# Patient Record
Sex: Female | Born: 1940 | Race: White | Hispanic: No | Smoking: Former smoker
Health system: Southern US, Community
[De-identification: ages and names within clinical notes are randomized; demographics above are authoritative.]

## PROBLEM LIST (undated history)

## (undated) DIAGNOSIS — J45909 Unspecified asthma, uncomplicated: Secondary | ICD-10-CM

## (undated) DIAGNOSIS — J449 Chronic obstructive pulmonary disease, unspecified: Secondary | ICD-10-CM

## (undated) DIAGNOSIS — F0390 Unspecified dementia without behavioral disturbance: Secondary | ICD-10-CM

## (undated) HISTORY — PX: TONSILLECTOMY: SUR1361

## (undated) HISTORY — PX: EYE SURGERY: SHX253

## (undated) HISTORY — DX: Unspecified asthma, uncomplicated: J45.909

---

## 1994-10-22 HISTORY — PX: LIVER REPAIR: SHX6734

## 2000-05-31 ENCOUNTER — Emergency Department (HOSPITAL_COMMUNITY): Admission: EM | Admit: 2000-05-31 | Discharge: 2000-05-31 | Payer: Self-pay | Admitting: Emergency Medicine

## 2000-05-31 ENCOUNTER — Encounter: Payer: Self-pay | Admitting: Emergency Medicine

## 2000-06-01 ENCOUNTER — Encounter: Payer: Self-pay | Admitting: Emergency Medicine

## 2000-06-26 ENCOUNTER — Encounter: Payer: Self-pay | Admitting: General Surgery

## 2000-06-27 ENCOUNTER — Inpatient Hospital Stay (HOSPITAL_COMMUNITY): Admission: EM | Admit: 2000-06-27 | Discharge: 2000-06-30 | Payer: Self-pay | Admitting: General Surgery

## 2012-02-11 ENCOUNTER — Telehealth: Payer: Self-pay

## 2012-02-11 ENCOUNTER — Other Ambulatory Visit: Payer: Self-pay | Admitting: Emergency Medicine

## 2012-02-11 DIAGNOSIS — Z111 Encounter for screening for respiratory tuberculosis: Secondary | ICD-10-CM

## 2012-02-11 NOTE — Telephone Encounter (Signed)
ESI placed and orders only for a single view chest x-ray for patient to have in the future

## 2012-02-11 NOTE — Telephone Encounter (Signed)
Pt is required to have tb test administered,cannot take needle stick(phobia),wants to request chest x-ray instead.will need order by dr Cleta Alberts??   Best phone [phone]

## 2012-02-11 NOTE — Telephone Encounter (Signed)
Can you order this?

## 2012-02-12 ENCOUNTER — Ambulatory Visit (INDEPENDENT_AMBULATORY_CARE_PROVIDER_SITE_OTHER): Payer: Medicare Other | Admitting: Internal Medicine

## 2012-02-12 ENCOUNTER — Ambulatory Visit: Payer: Medicare Other

## 2012-02-12 VITALS — BP 156/70 | HR 60 | Temp 97.5°F | Resp 16 | Ht 60.5 in | Wt 126.4 lb

## 2012-02-12 DIAGNOSIS — J45909 Unspecified asthma, uncomplicated: Secondary | ICD-10-CM

## 2012-02-12 MED ORDER — ALBUTEROL SULFATE HFA 108 (90 BASE) MCG/ACT IN AERS: 2.0000 | INHALATION_SPRAY | Freq: Four times a day (QID) | RESPIRATORY_TRACT | Status: DC | PRN

## 2012-02-12 MED ORDER — FLUTICASONE-SALMETEROL 100-50 MCG/DOSE IN AEPB: 1.0000 | INHALATION_SPRAY | Freq: Two times a day (BID) | RESPIRATORY_TRACT | Status: DC

## 2012-02-12 NOTE — Telephone Encounter (Signed)
Pt came into 102 and saw Dr Merla Riches and had her xray done on 02/12/12

## 2012-02-12 NOTE — Progress Notes (Signed)
  Subjective:    Patient ID: Cheryl Olson, female    DOB: 01-Sep-1941, 71 y.o.   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 Her illness is relatively stable and she has a rare need for her inhalers. She wonders if she was misdiagnosed and this is related to some childhood scarring. She has no current cough night sweats weight loss shortness of breath edema palpitations or generalized malaise It is pertinent that she has had a needle phobia since childhood   Review of SystemsNoncontributory Stable in recovery     Objective:   Physical Exam Vital signs stable except mild systolic hypertension Lungs clear   [facility] reading (PRIMARY) by  Dr. =Elevated right hemidiaphragm but no active disease       Assessment & Plan:  Problem #1 asthma mild Refill meds in case she needs them  Problem #2 exposure to communicable disease at work Her chest x-ray is clear  she should not need to be reevaluated unless she developsFever with cough and night sweats and/or weight loss

## 2012-05-31 ENCOUNTER — Emergency Department (HOSPITAL_COMMUNITY): Payer: Medicare Other

## 2012-05-31 ENCOUNTER — Encounter (HOSPITAL_COMMUNITY): Payer: Self-pay | Admitting: Emergency Medicine

## 2012-05-31 ENCOUNTER — Observation Stay (HOSPITAL_COMMUNITY)
Admission: EM | Admit: 2012-05-31 | Discharge: 2012-06-01 | Disposition: A | Discharge status: Home / Self Care | Payer: Medicare Other | Attending: Internal Medicine | Admitting: Internal Medicine

## 2012-05-31 DIAGNOSIS — R5381 Other malaise: Secondary | ICD-10-CM | POA: Insufficient documentation

## 2012-05-31 DIAGNOSIS — I169 Hypertensive crisis, unspecified: Secondary | ICD-10-CM

## 2012-05-31 DIAGNOSIS — R61 Generalized hyperhidrosis: Secondary | ICD-10-CM | POA: Insufficient documentation

## 2012-05-31 DIAGNOSIS — I10 Essential (primary) hypertension: Principal | ICD-10-CM | POA: Diagnosis present

## 2012-05-31 DIAGNOSIS — R42 Dizziness and giddiness: Secondary | ICD-10-CM | POA: Diagnosis present

## 2012-05-31 DIAGNOSIS — J4489 Other specified chronic obstructive pulmonary disease: Secondary | ICD-10-CM | POA: Insufficient documentation

## 2012-05-31 DIAGNOSIS — J449 Chronic obstructive pulmonary disease, unspecified: Secondary | ICD-10-CM

## 2012-05-31 DIAGNOSIS — E871 Hypo-osmolality and hyponatremia: Secondary | ICD-10-CM | POA: Diagnosis present

## 2012-05-31 DIAGNOSIS — R5383 Other fatigue: Secondary | ICD-10-CM | POA: Insufficient documentation

## 2012-05-31 HISTORY — DX: Chronic obstructive pulmonary disease, unspecified: J44.9

## 2012-05-31 LAB — URINALYSIS, ROUTINE W REFLEX MICROSCOPIC
Hgb urine dipstick: NEGATIVE
Nitrite: NEGATIVE
Specific Gravity, Urine: 1.012 (ref 1.005–1.030)
Urobilinogen, UA: 0.2 mg/dL (ref 0.0–1.0)
pH: 7 (ref 5.0–8.0)

## 2012-05-31 LAB — CBC WITH DIFFERENTIAL/PLATELET
Basophils Absolute: 0 10*3/uL (ref 0.0–0.1)
Basophils Relative: 0 % (ref 0–1)
Eosinophils Absolute: 0.1 10*3/uL (ref 0.0–0.7)
Eosinophils Relative: 1 % (ref 0–5)
HCT: 39 % (ref 36.0–46.0)
MCH: 31.1 pg (ref 26.0–34.0)
MCHC: 34.4 g/dL (ref 30.0–36.0)
MCV: 90.5 fL (ref 78.0–100.0)
Monocytes Absolute: 0.3 10*3/uL (ref 0.1–1.0)
Platelets: 250 10*3/uL (ref 150–400)
RDW: 13.3 % (ref 11.5–15.5)
WBC: 6.9 10*3/uL (ref 4.0–10.5)

## 2012-05-31 LAB — COMPREHENSIVE METABOLIC PANEL
ALT: 15 U/L (ref 0–35)
AST: 25 U/L (ref 0–37)
CO2: 23 mEq/L (ref 19–32)
Calcium: 9.7 mg/dL (ref 8.4–10.5)
Creatinine, Ser: 0.69 mg/dL (ref 0.50–1.10)
GFR calc non Af Amer: 86 mL/min — ABNORMAL LOW (ref >90)
Sodium: 131 mEq/L — ABNORMAL LOW (ref 135–145)
Total Protein: 7.2 g/dL (ref 6.0–8.3)

## 2012-05-31 LAB — CARDIAC PANEL(CRET KIN+CKTOT+MB+TROPI): Relative Index: INVALID (ref 0.0–2.5)

## 2012-05-31 LAB — TROPONIN I: Troponin I: <0.3 ng/mL (ref <0.30)

## 2012-05-31 MED ORDER — ONDANSETRON HCL 4 MG PO TABS: 4.0000 mg | ORAL_TABLET | Freq: Four times a day (QID) | ORAL | Status: DC | PRN

## 2012-05-31 MED ORDER — ONDANSETRON HCL 4 MG/2ML IJ SOLN: 4.0000 mg | Freq: Four times a day (QID) | INTRAMUSCULAR | Status: DC | PRN

## 2012-05-31 MED ORDER — ACETAMINOPHEN 325 MG PO TABS: 650.0000 mg | ORAL_TABLET | Freq: Four times a day (QID) | ORAL | Status: DC | PRN

## 2012-05-31 MED ORDER — LISINOPRIL 10 MG PO TABS
10.0000 mg | ORAL_TABLET | Freq: Every day | ORAL | Status: DC
  Administered 2012-05-31 – 2012-06-01 (×2): 10 mg via ORAL
  Filled 2012-05-31 (×2): qty 1

## 2012-05-31 MED ORDER — ENOXAPARIN SODIUM 40 MG/0.4ML ~~LOC~~ SOLN
40.0000 mg | SUBCUTANEOUS | Status: DC
  Filled 2012-05-31 (×2): qty 0.4

## 2012-05-31 MED ORDER — SODIUM CHLORIDE 0.9 % IJ SOLN: 3.0000 mL | Freq: Two times a day (BID) | INTRAMUSCULAR | Status: DC

## 2012-05-31 MED ORDER — HYDRALAZINE HCL 20 MG/ML IJ SOLN
5.0000 mg | Freq: Four times a day (QID) | INTRAMUSCULAR | Status: DC | PRN
  Filled 2012-05-31: qty 0.25

## 2012-05-31 MED ORDER — ALBUTEROL SULFATE HFA 108 (90 BASE) MCG/ACT IN AERS: 2.0000 | INHALATION_SPRAY | Freq: Four times a day (QID) | RESPIRATORY_TRACT | Status: DC | PRN

## 2012-05-31 MED ORDER — ACETAMINOPHEN 650 MG RE SUPP: 650.0000 mg | Freq: Four times a day (QID) | RECTAL | Status: DC | PRN

## 2012-05-31 MED ORDER — SODIUM CHLORIDE 0.9 % IV SOLN
INTRAVENOUS | Status: DC
  Administered 2012-05-31: 1000 mL via INTRAVENOUS

## 2012-05-31 MED ORDER — LABETALOL HCL 5 MG/ML IV SOLN
10.0000 mg | Freq: Once | INTRAVENOUS | Status: AC
  Administered 2012-05-31: 10 mg via INTRAVENOUS
  Filled 2012-05-31: qty 4

## 2012-05-31 MED ORDER — FLUTICASONE-SALMETEROL 100-50 MCG/DOSE IN AEPB
1.0000 | INHALATION_SPRAY | Freq: Two times a day (BID) | RESPIRATORY_TRACT | Status: DC
  Filled 2012-05-31: qty 14

## 2012-05-31 MED ORDER — LORAZEPAM 2 MG/ML IJ SOLN: 0.5000 mg | Freq: Four times a day (QID) | INTRAMUSCULAR | Status: DC | PRN

## 2012-05-31 MED ORDER — DIAZEPAM 5 MG PO TABS: 5.0000 mg | ORAL_TABLET | Freq: Once | ORAL | Status: DC

## 2012-05-31 NOTE — ED Notes (Signed)
Pt developed clamminess, weakness, dizziness, and one episode of emesis following group meeting at 1100 this a.m. Denies any pain. Since has had one episode of nausea w/o emesis

## 2012-05-31 NOTE — ED Notes (Signed)
Pt has walked to xray with xray tech. Pt getting a urine sample in xray. Will get EKG when pt returns

## 2012-05-31 NOTE — H&P (Signed)
[facility] History and Physical  Cheryl Olson MVH:[id number] DOB: 1941/06/04 DOA: 05/31/2012  Referring physician: Chaney Malling PCP: Lucilla Edin, MD   Chief Complaint: Accelerated hypertension  HPI:  [name] 71 year old Caucasian female with past medical history of COPD. She came into the hospital complaining about diaphoresis and generalized weakness. 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 After that patient felt lightheaded but she denies any feeling that she was about to faint. After she rested for some time she felt better and she decided to drive home, while she was driving home she has small episode of vomiting. But she still drove home, she was debating with herself to come to the emergency department or not. Then she came into the emergency department after she had even a smaller episode of vomiting. Also the patient denies any chest pain whatsoever, she denies any shortness of breath, denies any palpitations denies any fainting. With the vomiting she denied nausea before that, denied any abdominal pain, denied any diarrhea, she did not eat out recently. At the initial evaluation in the emergency department patient was found to have blood pressure of 199/78, with mild hyponatremia with a sodium of 133. Patient will be placed in observation overnight for the high blood pressure.  Review of Systems:  Constitutional: negative for anorexia, fevers and sweats Eyes: negative for irritation, redness and visual disturbance Ears, nose, mouth, throat, and face: negative for earaches, epistaxis, nasal congestion and sore throat Respiratory: negative for cough, dyspnea on exertion, sputum and wheezing Cardiovascular: Per history of present illness Gastrointestinal: Per history of present illness  Genitourinary:negative for dysuria, frequency  and hematuria Hematologic/lymphatic: negative for bleeding, easy bruising and lymphadenopathy Musculoskeletal:negative for arthralgias, muscle weakness and stiff joints Neurological: negative for coordination problems, gait problems, headaches and weakness Endocrine: negative for diabetic symptoms including polydipsia, polyuria and weight loss Allergic/Immunologic: negative for anaphylaxis, hay fever and urticaria   Past Medical History  Diagnosis Date  . COPD (chronic obstructive pulmonary disease)    Past Surgical History  Procedure Date  . Tonsillectomy    Social History:  reports that she has quit smoking. She has never used smokeless tobacco. She reports that she does not drink alcohol or use illicit drugs. Lives at home alone, she is ambulatory without help, her daughter and her son lives in the same street.  No Known Allergies  No family history on file.   Prior to Admission medications   Medication Sig Start Date End Date Taking? Authorizing Provider  albuterol (PROVENTIL HFA;VENTOLIN HFA) 108 (90 BASE) MCG/ACT inhaler Inhale 2 puffs into the lungs every 6 (six) hours as needed. 02/12/12 02/11/13 Yes Tonye Pearson, MD  diphenhydrAMINE (BENADRYL) 12.5 MG/5ML elixir Take 25 mg by mouth 4 (four) times daily as needed. For itching   Yes Historical Provider, MD  Fluticasone-Salmeterol (ADVAIR) 100-50 MCG/DOSE AEPB Inhale 1 puff into the lungs every 12 (twelve) hours. 02/12/12  Yes Tonye Pearson, MD   Physical Exam: Filed Vitals:   05/31/12 1425 05/31/12 1737 05/31/12 1739 05/31/12 1839  BP: 199/78 155/70 155/60 169/72  Pulse: 83   64  Temp: 97.9 F (36.6 C)     TempSrc: Oral     Resp: 16 16 19 13   Height: 5\' 2"  (1.575 m)     Weight: 61.236 kg (135 lb)     SpO2: 98%  97%   General appearance: alert, cooperative and no distress  Head: Normocephalic, without obvious abnormality, atraumatic  Eyes: conjunctivae/corneas clear. PERRL, EOM's intact. Fundi benign.    Nose: Nares normal. Septum midline. Mucosa normal. No drainage or sinus tenderness.  Throat: lips, mucosa, and tongue normal; teeth and gums normal  Neck: Supple, no masses, no cervical lymphadenopathy, no JVD appreciated, no meningeal signs Resp: clear to auscultation bilaterally  Chest wall: no tenderness  Cardio: regular rate and rhythm, S1, S2 normal, no murmur, click, rub or gallop  GI: soft, non-tender; bowel sounds normal; no masses, no organomegaly  Extremities: extremities normal, atraumatic, no cyanosis or edema  Skin: Skin color, texture, turgor normal. No rashes or lesions  Neurologic: Alert and oriented X 3, normal strength and tone. Normal symmetric reflexes. Normal coordination and gait  Labs on Admission:  Basic Metabolic Panel:  Lab 05/31/12 7829  NA 131*  K 3.9  CL 95*  CO2 23  GLUCOSE 99  BUN 13  CREATININE 0.69  CALCIUM 9.7  MG --  PHOS --   Liver Function Tests:  Lab 05/31/12 1654  AST 25  ALT 15  ALKPHOS 74  BILITOT 0.5  PROT 7.2  ALBUMIN 4.4   No results found for this basename: LIPASE:5,AMYLASE:5 in the last 168 hours No results found for this basename: AMMONIA:5 in the last 168 hours CBC:  Lab 05/31/12 1654  WBC 6.9  NEUTROABS 5.3  HGB 13.4  HCT 39.0  MCV 90.5  PLT 250   Cardiac Enzymes:  Lab 05/31/12 1654  CKTOTAL --  CKMB --  CKMBINDEX --  TROPONINI <0.30    BNP (last 3 results) No results found for this basename: PROBNP:3 in the last 8760 hours CBG: No results found for this basename: GLUCAP:5 in the last 168 hours  Radiological Exams on Admission: Dg Chest 2 View  05/31/2012  *RADIOLOGY REPORT*  Clinical Data: Generalized weakness.  Diaphoresis.  History of COPD and asthma.  Former smoker.  CHEST - 2 VIEW  Comparison: Two-view chest x-ray 02/12/2012.  Findings: Cardiomediastinal silhouette unremarkable, unchanged. Elevation of the right hemidiaphragm with associated pleuroparenchymal scarring at the right base, unchanged.   Lungs otherwise clear.  No pleural effusions.  Degenerative changes involving the thoracic spine.  No significant interval change.  IMPRESSION: No acute cardiopulmonary disease.  Stable examination.  Original Report Authenticated By: Arnell Sieving, M.D.   Ct Head Wo Contrast  05/31/2012  *RADIOLOGY REPORT*  Clinical Data: Generalized weakness.  CT HEAD WITHOUT CONTRAST  Technique:  Contiguous axial images were obtained from the base of the skull through the vertex without contrast.  Comparison: None.  Findings: No intracranial hemorrhage.  Prominent white matter type changes may represent result of small vessel disease.  No CT evidence of large acute infarct.  No intracranial mass lesion detected on this unenhanced exam.  No hydrocephalus.  Vascular calcifications.  IMPRESSION:  Prominent white matter type changes may represent result of small vessel disease.  No CT evidence of large acute infarct.  Original Report Authenticated By: Fuller Canada, M.D.    EKG: Independently reviewed. NSR  Assessment/Plan Principal Problem:  *Accelerated hypertension Active Problems:  Lightheadedness  COPD (chronic obstructive pulmonary disease)  Hyponatremia   Accelerated hypertension Patient denies any previous diagnoses of hypertension, notes from Dr. office before mentioned that she have mild elevated systolic blood pressure, but he did not mention the readings. Patient was given labetalol in the emergency department I start patient on lisinopril 10 mg  daily, hydralazine for systolic pressure of more than 160. Also 3 sets of cardiac enzymes ordered.  Lightheadedness Diaphoresis and lightheadedness, it could be secondary to the hospital pressure, patient also have some hyponatremia might indicate a little but of dehydration. Patient will be hydrated with IV fluids, she is currently denying any lightheadedness. CT head without contrast negative., No focal neurological signs.  Hyponatremia Mild  hyponatremia with sodium of 133, I will hydrate with IV fluids check BMP in the morning, likely mild dehydration.  COPD Stable chronic condition, continue her home inhaled bronchodilator/steroids  Code Status: Full Family Communication: Patient daughter Judeth Cornfield, and her son at bedside, plan discussed with patient and her family. Disposition Plan: Observation overnight on telemetry bed  Time spent: 60 Minutes  [facility] Pager [phone]  If 7PM-7AM, please contact night-coverage [email or web address] Password [email or web address] 05/31/2012, 7:40 PM

## 2012-05-31 NOTE — ED Provider Notes (Signed)
History     CSN: [id number]  Arrival date & time 05/31/12  1423   First MD Initiated Contact with Patient 05/31/12 1527      Chief Complaint  Patient presents with  . Weakness    (Consider location/radiation/quality/duration/timing/severity/associated sxs/prior treatment) HPI Comments: Cheryl Olson 71 y.o. female   The chief complaint is: Patient presents with:   Weakness   The patient has medical history significant for:   Past Medical History:   COPD (chronic obstructive pulmonary disease)                Patient presents for an acute episode of dizziness. Patient states that 11:30am she was overcome with a feeling of clamminess and nausea, with one episode of vomiting. She states that she had an episode like this in the past 10 years ago when she combined OTC cough syrup with her inhaler. Patient recounts that she recently took Children's Benadryl for bug bites and inhaler for COPD. She believes that this may be the result of a drug interaction. Denies fever, chills. Denies diarrhea or abdominal pain. Denies SOB, CP, but reports mild palpitations. Denies dysuria, frequency, or urgency. Denies sick contacts. Denies history of syncopal episodes.      Patient is a 71 y.o. female presenting with weakness. The history is provided by the patient.  Weakness The primary symptoms include dizziness. Primary symptoms do not include headaches, fever, nausea or vomiting.  Dizziness also occurs with weakness and diaphoresis. Dizziness does not occur with nausea or vomiting.   Additional symptoms include weakness.    Past Medical History  Diagnosis Date  . COPD (chronic obstructive pulmonary disease)     Past Surgical History  Procedure Date  . Tonsillectomy     No family history on file.  History  Substance Use Topics  . Smoking status: Former Games developer  . Smokeless tobacco: Never Used  . 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 Negative for fever and chills.       Clamminess  Eyes: Negative for visual disturbance.  Respiratory: Negative for shortness of breath.   Cardiovascular: Negative for chest pain, palpitations and leg swelling.  Gastrointestinal: Negative for nausea, vomiting, abdominal pain and diarrhea.  Genitourinary: Negative for dysuria and urgency.  Musculoskeletal: Negative for gait problem.  Neurological: Positive for dizziness and weakness. Negative for syncope, numbness and headaches.  All other systems reviewed and are negative.    Allergies  Review of patient's allergies indicates no known allergies.  Home Medications   Current Outpatient Rx  Name Route Sig Dispense Refill  . ALBUTEROL SULFATE HFA 108 (90 BASE) MCG/ACT IN AERS Inhalation Inhale 2 puffs into the lungs every 6 (six) hours as needed.    Marland Kitchen DIPHENHYDRAMINE HCL 12.5 MG/5ML PO ELIX Oral Take 25 mg by mouth 4 (four) times daily as needed. For itching    . FLUTICASONE-SALMETEROL 100-50 MCG/DOSE IN AEPB Inhalation Inhale 1 puff into the lungs every 12 (twelve) hours.      BP 199/78  Pulse 83  Temp 97.9 F (36.6 C) (Oral)  Resp 16  Ht 5\' 2"  (1.575 m)  Wt 135 lb (61.236 kg)  BMI 24.69 kg/m2  SpO2 98%  Physical Exam  Nursing note and vitals reviewed. Constitutional: She is oriented to person, place, and time. She appears well-developed and well-nourished.  HENT:  Head: Normocephalic and atraumatic.  Mouth/Throat: Oropharynx is clear and moist.  Eyes: Conjunctivae and EOM are normal. Pupils are equal, round, and reactive to light.  Neck: Normal range of motion. Neck supple.  Cardiovascular: Normal rate, regular rhythm, normal heart sounds and intact distal pulses.   Pulmonary/Chest: Effort normal and breath sounds normal.  Abdominal: Soft. Bowel sounds are normal. There is no tenderness.    Musculoskeletal: Normal range of motion. She exhibits no edema.  Neurological: She is alert and oriented to person, place, and time. No cranial nerve deficit. She exhibits normal muscle tone. Coordination normal.       Cranial nerves II-XII intact. Romberg & Pronator negative.  Skin: Skin is warm and dry.    ED Course  Procedures (including critical care time)   Labs Reviewed  CBC WITH DIFFERENTIAL  COMPREHENSIVE METABOLIC PANEL  URINALYSIS, ROUTINE W REFLEX MICROSCOPIC  TROPONIN I   Ct Head Wo Contrast  05/31/2012  *RADIOLOGY REPORT*  Clinical Data: Generalized weakness.  CT HEAD WITHOUT CONTRAST  Technique:  Contiguous axial images were obtained from the base of the skull through the vertex without contrast.  Comparison: None.  Findings: No intracranial hemorrhage.  Prominent white matter type changes may represent result of small vessel disease.  No CT evidence of large acute infarct.  No intracranial mass lesion detected on this unenhanced exam.  No hydrocephalus.  Vascular calcifications.  IMPRESSION:  Prominent white matter type changes may represent result of small vessel disease.  No CT evidence of large acute infarct.  Original Report Authenticated By: Fuller Canada, M.D.       DG Chest 2 View (Final result)   Result time:05/31/12 1710    Final result by Rad Results In Interface (05/31/12 17:10:46)    Narrative:   *RADIOLOGY REPORT*  Clinical Data: Generalized weakness. Diaphoresis. History of COPD and asthma. Former smoker.  CHEST - 2 VIEW  Comparison: Two-view chest x-ray 02/12/2012.  Findings: Cardiomediastinal silhouette unremarkable, unchanged. Elevation of the right hemidiaphragm with associated pleuroparenchymal scarring at the right base, unchanged. Lungs otherwise clear. No pleural effusions. Degenerative changes involving the thoracic spine. No significant interval change.  IMPRESSION: No acute cardiopulmonary disease. Stable examination.  Original  Report Authenticated By: Arnell Sieving, M.D   Results for orders placed during the hospital encounter of 05/31/12  CBC WITH DIFFERENTIAL      Component Value Range   WBC 6.9  4.0 - 10.5 K/uL   RBC 4.31  3.87 - 5.11 MIL/uL   Hemoglobin 13.4  12.0 - 15.0 g/dL   HCT 16.1  09.6 - 04.5 %   MCV 90.5  78.0 - 100.0 fL   MCH 31.1  26.0 - 34.0 pg   MCHC 34.4  30.0 - 36.0 g/dL   RDW 40.9  81.1 - 91.4 %   Platelets 250  150 - 400 K/uL   Neutrophils Relative 77  43 - 77 %   Neutro Abs 5.3  1.7 - 7.7 K/uL   Lymphocytes Relative 18  12 - 46 %   Lymphs Abs 1.3  0.7 - 4.0 K/uL   Monocytes Relative 4  3 - 12 %   Monocytes Absolute 0.3  0.1 - 1.0 K/uL   Eosinophils Relative 1  0 - 5 %   Eosinophils Absolute 0.1  0.0 - 0.7 K/uL   Basophils Relative 0  0 - 1 %   Basophils Absolute 0.0  0.0 - 0.1 K/uL  COMPREHENSIVE METABOLIC PANEL      Component Value Range   Sodium 131 (*) 135 - 145 mEq/L   Potassium 3.9  3.5 - 5.1 mEq/L   Chloride 95 (*) 96 - 112 mEq/L   CO2 23  19 - 32 mEq/L   Glucose, Bld 99  70 - 99 mg/dL   BUN 13  6 - 23 mg/dL   Creatinine, Ser 7.82  0.50 - 1.10 mg/dL   Calcium 9.7  8.4 - 95.6 mg/dL   Total Protein 7.2  6.0 - 8.3 g/dL   Albumin 4.4  3.5 - 5.2 g/dL   AST 25  0 - 37 U/L   ALT 15  0 - 35 U/L   Alkaline Phosphatase 74  39 - 117 U/L   Total Bilirubin 0.5  0.3 - 1.2 mg/dL   GFR calc non Af Amer 86 (*) >90 mL/min   GFR calc Af Amer >90  >90 mL/min  URINALYSIS, ROUTINE W REFLEX MICROSCOPIC      Component Value Range   Color, Urine YELLOW  YELLOW   APPearance CLEAR  CLEAR   Specific Gravity, Urine 1.012  1.005 - 1.030   pH 7.0  5.0 - 8.0   Glucose, UA NEGATIVE  NEGATIVE mg/dL   Hgb urine dipstick NEGATIVE  NEGATIVE   Bilirubin Urine NEGATIVE  NEGATIVE   Ketones, ur NEGATIVE  NEGATIVE mg/dL   Protein, ur NEGATIVE  NEGATIVE mg/dL   Urobilinogen, UA 0.2  0.0 - 1.0 mg/dL   Nitrite NEGATIVE  NEGATIVE   Leukocytes, UA NEGATIVE  NEGATIVE  TROPONIN I      Component  Value Range   Troponin I <0.30  <0.30 ng/mL     Results for orders placed during the hospital encounter of 05/31/12  CBC WITH DIFFERENTIAL      Component Value Range   WBC 6.9  4.0 - 10.5 K/uL   RBC 4.31  3.87 - 5.11 MIL/uL   Hemoglobin 13.4  12.0 - 15.0 g/dL   HCT 21.3  08.6 - 57.8 %   MCV 90.5  78.0 - 100.0 fL   MCH 31.1  26.0 - 34.0 pg   MCHC 34.4  30.0 - 36.0 g/dL   RDW 46.9  62.9 - 52.8 %   Platelets 250  150 - 400 K/uL   Neutrophils Relative 77  43 - 77 %   Neutro Abs 5.3  1.7 - 7.7 K/uL   Lymphocytes Relative 18  12 - 46 %   Lymphs Abs 1.3  0.7 - 4.0 K/uL   Monocytes Relative 4  3 - 12 %   Monocytes Absolute 0.3  0.1 - 1.0 K/uL   Eosinophils Relative 1  0 - 5 %   Eosinophils Absolute 0.1  0.0 - 0.7 K/uL   Basophils Relative 0  0 - 1 %   Basophils Absolute 0.0  0.0 - 0.1 K/uL      Date: 05/31/2012  Rate: 51  Rhythm: sinus bradycardia  QRS Axis: normal  Intervals: normal  ST/T Wave abnormalities: normal  Conduction Disutrbances:none  Narrative Interpretation: Normal ECG  Old EKG Reviewed: changes noted patient not bradycardic on prior ECG    1. Hypertensive crisis       MDM  Patient presented for dizziness, clamminess, weakness, and episode of emesis. Patient states she had a similar episode in the past due to drug interaction. Patient was found to  me markedly hypertensive 199/78 and given labetalol, with improvement to 155/72 CBC: unremarkable, ZOX:[id number] , EKG: sinus bradycardia otherwise normal , Troponin: unremarkable CXR: unremarkable CT Head: unremarkable. Patient will admitted to Hospitalist service for hypertensive urgency and observation. Plan discussed with Dr. Silverio Lay.        Pixie Casino, PA-C 05/31/12 1859

## 2012-05-31 NOTE — ED Notes (Signed)
Admission MD at pt bedside.  

## 2012-05-31 NOTE — ED Notes (Signed)
Attempted to call report to 4W. Nurse unable to take report. Will call back.

## 2012-05-31 NOTE — ED Notes (Signed)
Attempted to call report to 4W- nurse Clydie Braun, but nurse still in report. Left name and phone number for nurse to call when she is done with report.

## 2012-05-31 NOTE — ED Provider Notes (Signed)
Medical screening examination/treatment/procedure(s) were conducted as a shared visit with non-physician practitioner(s) and myself.  I personally evaluated the patient during the encounter  This is a 71 yo F here with CP, diaphoresis and lightheadness that acutely occurred at 11am today. She felt better afterwards but was found to be hypertensive 199/70 here. Her labs including trop x 1 was negative. EKG and CT head was unremarkable. After 10mg  labetalol, her BP decreased to 150 systolic. She is admitted for hypertensive urgency to telemetry.   Richardean Canal, MD 05/31/12 [id number]

## 2012-05-31 NOTE — ED Notes (Signed)
Pt reports she felt clammy and nauseate suddenly while at work. Pt reports one episode of vomiting while driving to ER. Pt states no shortness of breath, no weakness in extremities or shortness of breath

## 2012-06-01 DIAGNOSIS — I10 Essential (primary) hypertension: Secondary | ICD-10-CM

## 2012-06-01 LAB — CARDIAC PANEL(CRET KIN+CKTOT+MB+TROPI)
CK, MB: 3.2 ng/mL (ref 0.3–4.0)
Relative Index: INVALID (ref 0.0–2.5)
Total CK: 69 U/L (ref 7–177)
Troponin I: <0.3 ng/mL (ref <0.30)

## 2012-06-01 LAB — BASIC METABOLIC PANEL
Calcium: 8.7 mg/dL (ref 8.4–10.5)
GFR calc Af Amer: >90 mL/min (ref >90)
GFR calc non Af Amer: >90 mL/min (ref >90)
Potassium: 3.8 mEq/L (ref 3.5–5.1)
Sodium: 130 mEq/L — ABNORMAL LOW (ref 135–145)

## 2012-06-01 LAB — CBC
MCHC: 35.4 g/dL (ref 30.0–36.0)
RDW: 13.3 % (ref 11.5–15.5)

## 2012-06-01 MED ORDER — LISINOPRIL 10 MG PO TABS: 10.0000 mg | ORAL_TABLET | Freq: Every day | ORAL | Status: DC

## 2012-06-01 NOTE — Progress Notes (Signed)
Walked entire hall, no dizziness, no sweating, no c/o pain.

## 2012-06-01 NOTE — Discharge Summary (Addendum)
Physician Discharge Summary  Cheryl Olson ZOX:[id number] DOB: [birth year] DOA: 05/31/2012  PCP: Lucilla Edin, MD  Admit date: 05/31/2012 Discharge date: 06/01/2012  Recommendations for Outpatient Follow-up:  1. Pt will need to follow up with PCP in 2-3 weeks post discharge 2. Please obtain BMP to evaluate electrolytes and specifically sodium level 3. Please also check CBC to evaluate Hg and Hct levels 4. Please also check BP and discuss if BP medication is indicated 5. BP on discharge has been stable and pt opted for now BP medication 6. I have prescribed Lisinopril 10 mg table and advised pt to take BP medication if BP is > 140/90 7. I advised to her to keep checking BP regularly at home and if > 140/90 to call her to call her PCP 8. Please check if pt has been taking Lisinopril  Discharge Diagnoses: Dizziness secondary to dehydration and ? accelerated hypertension  Principal Problem:  *Accelerated hypertension Active Problems:  Lightheadedness  COPD (chronic obstructive pulmonary disease)  Hyponatremia  Discharge Condition: Stable  Diet recommendation: Heart healthy diet discussed in details   History of present illness:  Pt is 71 yo female with no significant PMH other than COPD, which is apparently well controlled who presented to [facility] 08-09 with main concern of one episode of dizziness and lightheadedness, denied chest pain or shortness of breath, no abdominal or urinary concerns, no similar episodes in the past. Pt does reports poor oral intake over the past few day as she was busy at home, with kids.  Hospital Course:  Principal Problem:  *Accelerated hypertension - unclear what the provoking factor is and per pt she has no history of hypertension - she was started on Lisinopril 10 gm po daily and her BP decreased from 170's/100's down to 112/70 mmHg - I have given her prescription for Lisinopril to take but she prefers to keep her BP monitored and decide with PCP help if  the medication is needed - I have advised her to keep checking her BP regularly at home and if > 140/90, she should call PCP  Active Problems:  Lightheadedness - perhaps secondary to principal problem vs dehydration in the setting of hyponatremia - pt doing well this AM and wants to go home - CE's x 3 within normal limits - no events on telemetry over 24 hours    COPD (chronic obstructive pulmonary disease) - stable clinically - pt maintaining oxygen saturations within normal limits   Hyponatremia - perhaps of pre renal etiology - hydration provided and Na stable this AM - I have advised pt to have this followed up on by PCP - will send medical records to PCP  Procedures/Studies: Dg Chest 2 View 05/31/2012    IMPRESSION:  No acute cardiopulmonary disease.   Stable examination.    Ct Head Wo Contrast 05/31/2012   IMPRESSION:   Prominent white matter type changes may represent result of small vessel disease.   No CT evidence of large acute infarct.    Consultations:  None  Antibiotics:  None  Discharge Exam: Filed Vitals:   06/01/12 0612  BP: 119/60  Pulse: 60  Temp: 98 F (36.7 C)  Resp: 18   Filed Vitals:   05/31/12 2131 05/31/12 2218 06/01/12 0300 06/01/12 0612  BP: 170/63 143/68 140/66 119/60  Pulse: 70 64  60  Temp: 97.9 F (36.6 C)   98 F (36.7 C)  TempSrc: Oral   Oral  Resp: 18   18  Height: 5\' 2"  (  1.575 m)     Weight: 58.786 kg (129 lb 9.6 oz)     SpO2: 96%   96%    General: Pt is alert, follows commands appropriately, not in acute distress Cardiovascular: Regular rate and rhythm, S1/S2 +, no murmurs, no rubs, no gallops Respiratory: Clear to auscultation bilaterally, no wheezing, no crackles, no rhonchi Abdominal: Soft, non tender, non distended, bowel sounds +, no guarding Extremities: no edema, no cyanosis, pulses palpable bilaterally DP and PT Neuro: Grossly nonfocal  Discharge Instructions  Discharge Orders    Future Orders Please  Complete By Expires   Diet - low sodium heart healthy      Increase activity slowly        Medication List  As of 06/01/2012  9:05 AM   TAKE these medications     albuterol 108 (90 BASE) MCG/ACT inhaler   Inhale 2 puffs into the lungs every 6 (six) hours as needed.      diphenhydrAMINE 12.5 MG/5ML elixir   Take 25 mg by mouth 4 (four) times daily as needed. For itching      Fluticasone-Salmeterol 100-50 MCG/DOSE Aepb   Inhale 1 puff into the lungs every 12 (twelve) hours.             Lisinopril 10 mg PO daily     Follow-up Information    Follow up with DAUB, STEVE A, MD. (As needed)    Contact information:   [address] [place] [address] [phone]           The results of significant diagnostics from this hospitalization (including imaging, microbiology, ancillary and laboratory) are listed below for reference.     Microbiology: No results found for this or any previous visit (from the past 240 hour(s)).   Labs: Basic Metabolic Panel:  Lab 06/01/12 2130 05/31/12 1654  NA 130* 131*  K 3.8 3.9  CL 99 95*  CO2 23 23  GLUCOSE 99 99  BUN 9 13  CREATININE 0.59 0.69  CALCIUM 8.7 9.7  MG -- --  PHOS -- --   Liver Function Tests:  Lab 05/31/12 1654  AST 25  ALT 15  ALKPHOS 74  BILITOT 0.5  PROT 7.2  ALBUMIN 4.4   CBC:  Lab 06/01/12 0305 05/31/12 1654  WBC 6.2 6.9  NEUTROABS -- 5.3  HGB 11.1* 13.4  HCT 31.4* 39.0  MCV 88.5 90.5  PLT 219 250   Cardiac Enzymes:  Lab 06/01/12 0305 05/31/12 1946 05/31/12 1654  CKTOTAL 69 76 --  CKMB 3.2 3.6 --  CKMBINDEX -- -- --  TROPONINI <0.30 <0.30 <0.30    SIGNED: Time coordinating discharge: Over 30 minutes  Debbora Presto, MD  [facility] 06/01/2012, 9:05 AM Pager [phone]  If 7PM-7AM, please contact night-coverage [email or web address] Password [email or web address]

## 2012-07-21 ENCOUNTER — Ambulatory Visit (INDEPENDENT_AMBULATORY_CARE_PROVIDER_SITE_OTHER): Payer: Medicare Other | Admitting: Family Medicine

## 2012-07-21 VITALS — BP 120/68 | HR 66 | Temp 98.5°F | Resp 16 | Ht 61.5 in | Wt 129.0 lb

## 2012-07-21 DIAGNOSIS — L309 Dermatitis, unspecified: Secondary | ICD-10-CM

## 2012-07-21 DIAGNOSIS — L259 Unspecified contact dermatitis, unspecified cause: Secondary | ICD-10-CM

## 2012-07-21 MED ORDER — MUPIROCIN 2 % EX OINT: TOPICAL_OINTMENT | Freq: Three times a day (TID) | CUTANEOUS | Status: DC

## 2012-07-21 NOTE — Progress Notes (Signed)
  Subjective:    Patient ID: Cheryl Olson, female    DOB: [birth year], 71 y.o.   MRN: [id number] Chief Complaint  Patient presents with  . Rash    left knee    HPI  Has had an intensely pruritic rash on her left knee. Has not spread. No contacts with similar rash. Not painful. Has never had anything like this prior - has had for over a wk now and it just won't go away. Has tried a variety of otc products - anti-itch, top benadryl and hydrocortisone, top antibiotic ointment w/o relief.  Past Medical History  Diagnosis Date  . COPD (chronic obstructive pulmonary disease)    Current Outpatient Prescriptions on File Prior to Visit  Medication Sig Dispense Refill  . albuterol (PROVENTIL HFA;VENTOLIN HFA) 108 (90 BASE) MCG/ACT inhaler Inhale 2 puffs into the lungs every 6 (six) hours as needed.      . diphenhydrAMINE (BENADRYL) 12.5 MG/5ML elixir Take 25 mg by mouth 4 (four) times daily as needed. For itching      . Fluticasone-Salmeterol (ADVAIR) 100-50 MCG/DOSE AEPB Inhale 1 puff into the lungs every 12 (twelve) hours.      Marland Kitchen lisinopril (PRINIVIL,ZESTRIL) 10 MG tablet Take 1 tablet (10 mg total) by mouth daily.  30 tablet  1   No current facility-administered medications on file prior to visit.   No Known Allergies   Review of Systems  Constitutional: Negative for fever, chills and diaphoresis.  Musculoskeletal: Negative for myalgias, joint swelling, arthralgias and gait problem.  Skin: Positive for rash. Negative for color change, pallor and wound.  Allergic/Immunologic: Negative for environmental allergies, food allergies and immunocompromised state.  Neurological: Negative for weakness and numbness.  Hematological: Negative for adenopathy. Does not bruise/bleed easily.  Psychiatric/Behavioral: Negative for sleep disturbance.      BP 120/68  Pulse 66  Temp(Src) 98.5 F (36.9 C) (Oral)  Resp 16  Ht 5' 1.5" (1.562 m)  Wt 129 lb (58.514 kg)  BMI 23.98 kg/m2  SpO2  100% Objective:   Physical Exam  Constitutional: She is oriented to person, place, and time. She appears well-developed and well-nourished. No distress.  HENT:  Head: Normocephalic and atraumatic.  Right Ear: External ear normal.  Eyes: Conjunctivae are normal. No scleral icterus.  Pulmonary/Chest: Effort normal.  Neurological: She is alert and oriented to person, place, and time.  Skin: Skin is warm and dry. Rash noted. Rash is vesicular. She is not diaphoretic.  Left knee with vesicles in a linear pattern on erythematous base with some clear crusting where vesicles had burst  Psychiatric: She has a normal mood and affect. Her behavior is normal.          Assessment & Plan:  Dermatitis - treated for possible impetigo-type skin infection with topical antibiotic and pt told to call or RTC if no improvement or worsening. In hind-sight - I feel sure that this was actually a rus dermatitis - likely poison ivy but pt apparently/hopefully improved anyway.   Meds ordered this encounter  Medications  . mupirocin ointment (BACTROBAN) 2 %    Sig: Apply topically 3 (three) times daily.    Dispense:  22 g    Refill:  1

## 2013-04-20 IMAGING — CR DG CHEST 2V
2 series · 2 of 2 positions shown · non-contrast
Comparison: Two-view chest x-ray 02/12/2012.

CLINICAL DATA: Generalized weakness.  Diaphoresis.  History of COPD
and asthma.  Former smoker.

CHEST - 2 VIEW

[w chest pa]
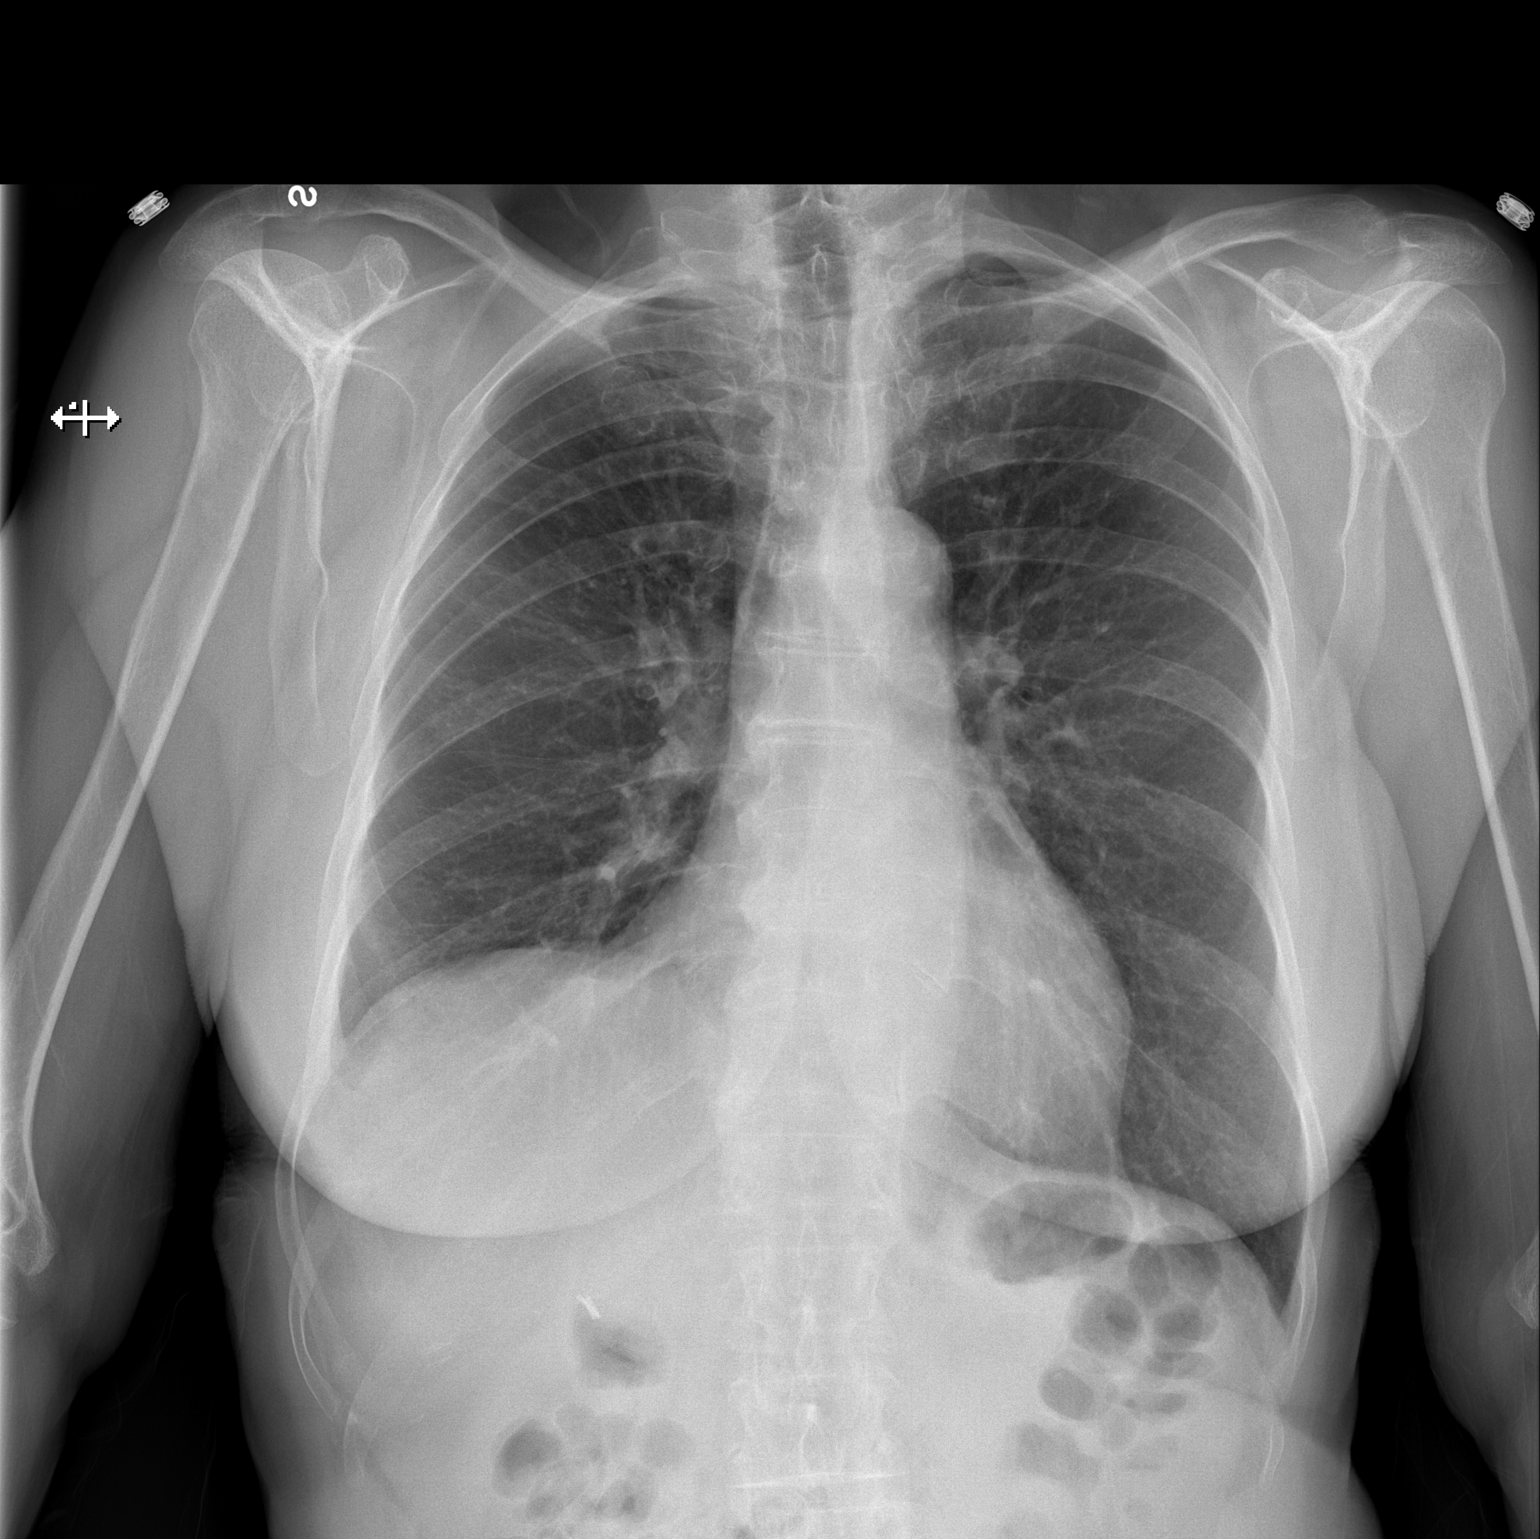

[w chest lat]
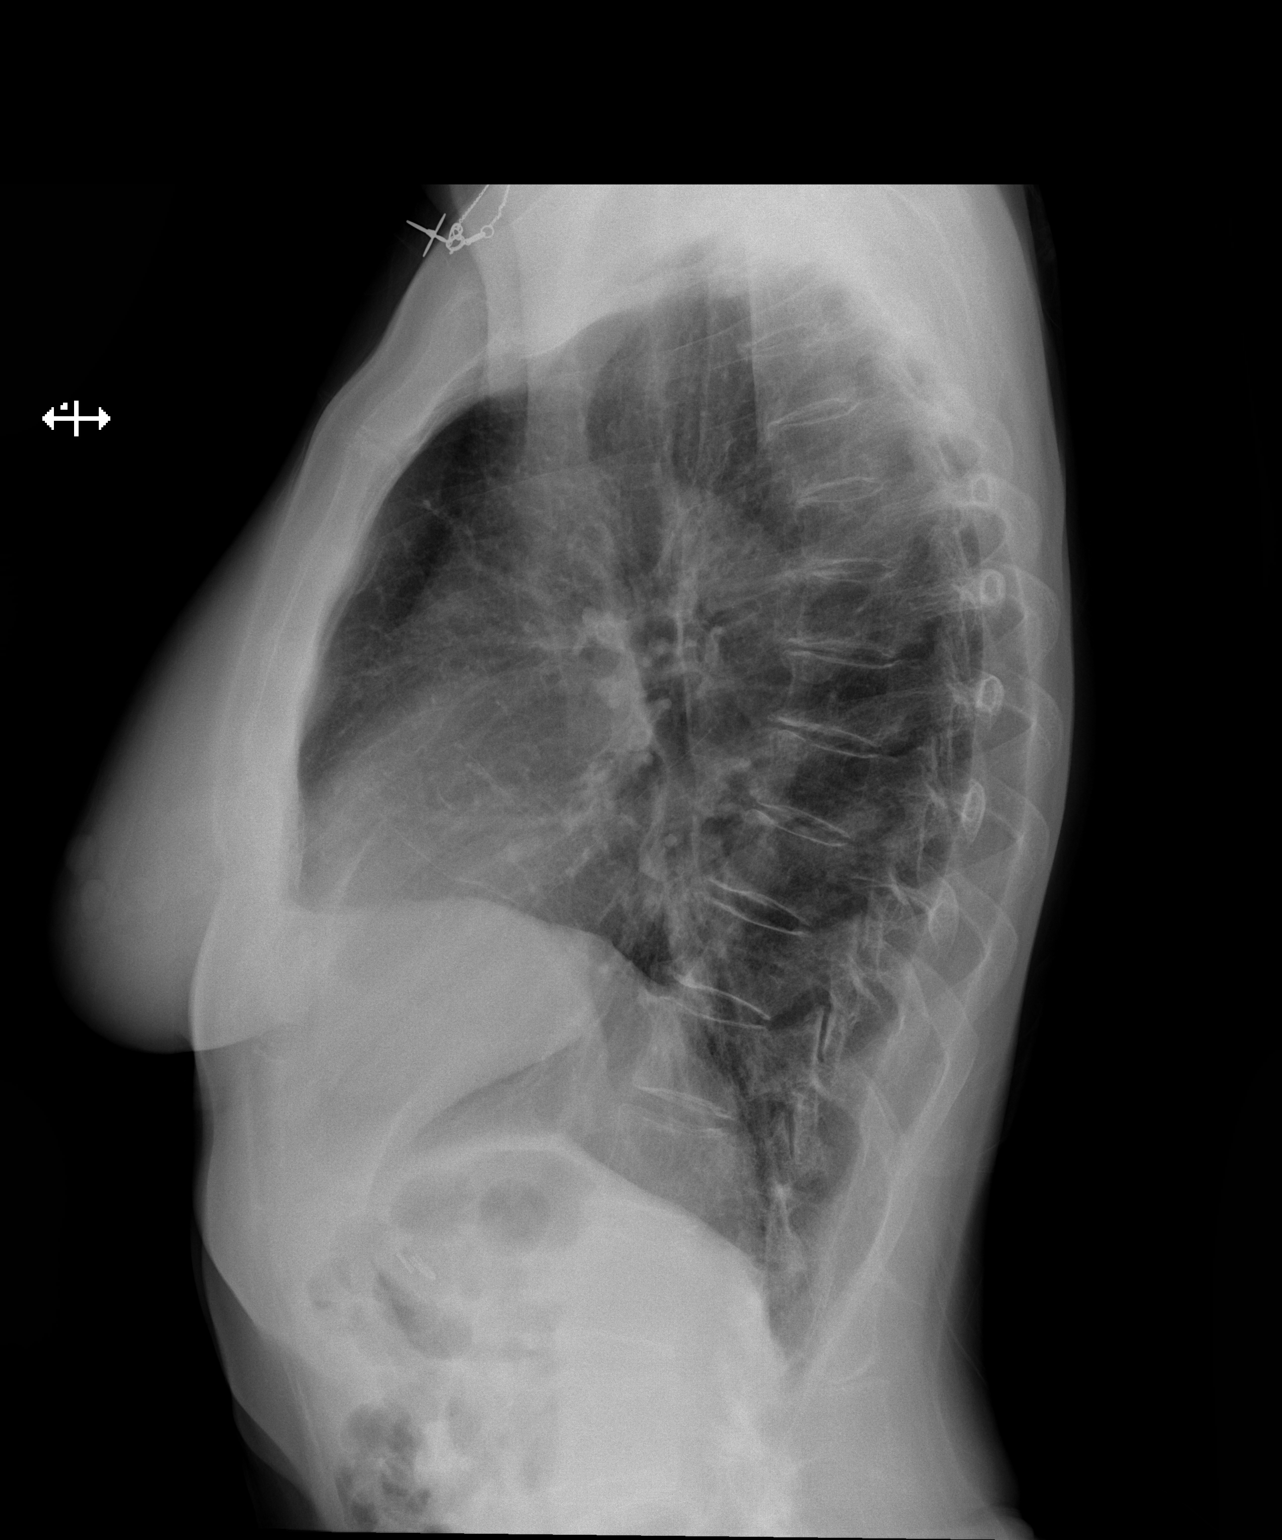

[2 of 2 positions shown; findings below may reference images not displayed]

FINDINGS: Cardiomediastinal silhouette unremarkable, unchanged.
Elevation of the right hemidiaphragm with associated
pleuroparenchymal scarring at the right base, unchanged.  Lungs
otherwise clear.  No pleural effusions.  Degenerative changes
involving the thoracic spine.  No significant interval change.
IMPRESSION: No acute cardiopulmonary disease.  Stable examination.

## 2013-09-14 ENCOUNTER — Ambulatory Visit (INDEPENDENT_AMBULATORY_CARE_PROVIDER_SITE_OTHER): Payer: Medicare Other | Admitting: Family Medicine

## 2013-09-14 VITALS — BP 158/70 | HR 67 | Temp 98.5°F | Resp 16 | Ht 60.0 in | Wt 130.0 lb

## 2013-09-14 DIAGNOSIS — J45909 Unspecified asthma, uncomplicated: Secondary | ICD-10-CM

## 2013-09-14 MED ORDER — OSELTAMIVIR PHOSPHATE 75 MG PO CAPS: 75.0000 mg | ORAL_CAPSULE | Freq: Two times a day (BID) | ORAL | Status: DC

## 2013-09-14 MED ORDER — FLUTICASONE-SALMETEROL 100-50 MCG/DOSE IN AEPB: 1.0000 | INHALATION_SPRAY | Freq: Two times a day (BID) | RESPIRATORY_TRACT | Status: DC

## 2013-09-14 MED ORDER — ALBUTEROL SULFATE HFA 108 (90 BASE) MCG/ACT IN AERS: 2.0000 | INHALATION_SPRAY | Freq: Four times a day (QID) | RESPIRATORY_TRACT | Status: DC | PRN

## 2013-09-14 NOTE — Patient Instructions (Signed)
Asthma, Adult Asthma is a recurring condition in which the airways tighten and narrow. Asthma can make it difficult to breathe. It can cause coughing, wheezing, and shortness of breath. Asthma episodes (also called asthma attacks) range from minor to life-threatening. Asthma cannot be cured, but medicines and lifestyle changes can help control it. CAUSES Asthma is believed to be caused by inherited (genetic) and environmental factors, but its exact cause is unknown. Asthma may be triggered by allergens, lung infections, or irritants in the air. Asthma triggers are different for each person. Common triggers include:   Animal dander.  Dust mites.  Cockroaches.  Pollen from trees or grass.  Mold.  Smoke.  Air pollutants such as dust, household cleaners, hair sprays, aerosol sprays, paint fumes, strong chemicals, or strong odors.  Cold air, weather changes, and winds (which increase molds and pollens in the air).  Strong emotional expressions such as crying or laughing hard.  Stress.  Certain medicines (such as aspirin) or types of drugs (such as beta-blockers).  Sulfites in foods and drinks. Foods and drinks that may contain sulfites include dried fruit, potato chips, and sparkling grape juice.  Infections or inflammatory conditions such as the flu, a cold, or an inflammation of the nasal membranes (rhinitis).  Gastroesophageal reflux disease (GERD).  Exercise or strenuous activity. SYMPTOMS Symptoms may occur immediately after asthma is triggered or many hours later. Symptoms include:  Wheezing.  Excessive nighttime or early morning coughing.  Frequent or severe coughing with a common cold.  Chest tightness.  Shortness of breath. DIAGNOSIS  The diagnosis of asthma is made by a review of your medical history and a physical exam. Tests may also be performed. These may include:  Lung function studies. These tests show how much air you breath in and out.  Allergy  tests.  Imaging tests such as X-rays. TREATMENT  Asthma cannot be cured, but it can usually be controlled. Treatment involves identifying and avoiding your asthma triggers. It also involves medicines. There are 2 classes of medicine used for asthma treatment:   Controller medicines. These prevent asthma symptoms from occurring. They are usually taken every day.  Reliever or rescue medicines. These quickly relieve asthma symptoms. They are used as needed and provide short-term relief. Your health care provider will help you create an asthma action plan. An asthma action plan is a written plan for managing and treating your asthma attacks. It includes a list of your asthma triggers and how they may be avoided. It also includes information on when medicines should be taken and when their dosage should be changed. An action plan may also involve the use of a device called a peak flow meter. A peak flow meter measures how well the lungs are working. It helps you monitor your condition. HOME CARE INSTRUCTIONS   Take medicine as directed by your health care provider. Speak with your health care provider if you have questions about how or when to take the medicines.  Use a peak flow meter as directed by your health care provider. Record and keep track of readings.  Understand and use the action plan to help minimize or stop an asthma attack without needing to seek medical care.  Control your home environment in the following ways to help prevent asthma attacks:  Do not smoke. Avoid being exposed to secondhand smoke.  Change your heating and air conditioning filter regularly.  Limit your use of fireplaces and wood stoves.  Get rid of pests (such as roaches and   mice) and their droppings.  Throw away plants if you see mold on them.  Clean your floors and dust regularly. Use unscented cleaning products.  Try to have someone else vacuum for you regularly. Stay out of rooms while they are being  vacuumed and for a short while afterward. If you vacuum, use a dust mask from a hardware store, a double-layered or microfilter vacuum cleaner bag, or a vacuum cleaner with a HEPA filter.  Replace carpet with wood, tile, or vinyl flooring. Carpet can trap dander and dust.  Use allergy-proof pillows, mattress covers, and box spring covers.  Wash bed sheets and blankets every week in hot water and dry them in a dryer.  Use blankets that are made of polyester or cotton.  Clean bathrooms and kitchens with bleach. If possible, have someone repaint the walls in these rooms with mold-resistant paint. Keep out of the rooms that are being cleaned and painted.  Wash hands frequently. SEEK MEDICAL CARE IF:   You have wheezing, shortness of breath, or a cough even if taking medicine to prevent attacks.  The colored mucus you cough up (sputum) is thicker than usual.  Your sputum changes from clear or white to yellow, green, gray, or bloody.  You have any problems that may be related to the medicines you are taking (such as a rash, itching, swelling, or trouble breathing).  You are using a reliever medicine more than 2 3 times per week.  Your peak flow is still at 50 79% of you personal best after following your action plan for 1 hour. SEEK IMMEDIATE MEDICAL CARE IF:   You seem to be getting worse and are unresponsive to treatment during an asthma attack.  You are short of breath even at rest.  You get short of breath when doing very little physical activity.  You have difficulty eating, drinking, or talking due to asthma symptoms.  You develop chest pain.  You develop a fast heartbeat.  You have a bluish color to your lips or fingernails.  You are lightheaded, dizzy, or faint.  Your peak flow is less than 50% of your personal best.  You have a fever or persistent symptoms for more than 2 3 days.  You have a fever and symptoms suddenly get worse. MAKE SURE YOU:   Understand these  instructions.  Will watch your condition.  Will get help right away if you are not doing well or get worse. Document Released: 10/08/2005 Document Revised: 06/10/2013 Document Reviewed: 05/07/2013 [employer] Patient Information 2014 [employer].  

## 2013-09-14 NOTE — Progress Notes (Signed)
Subjective:    Patient ID: Cheryl Olson, female    DOB: [birth year], 72 y.o.   MRN: [id number] This chart was scribed for Elvina Sidle, MD by Valera Castle, ED Scribe. This patient was seen in room 11 and the patient's care was started at 9:56 AM.  HPI Cheryl Olson is a 72 y.o. female who presents to the [facility] requesting a refill for her breathing treatment/inhaler. She reports not having used her breathing treatment for 1 month due to running out. She reports being healthy. She has a h/o COPD and HTN. Her BP today was 158/70. She denies having her flu vaccination, colonoscopy. 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 She denies any unusual breathing complications, and any other associated symptoms.  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  PCP - Lucilla Edin, MD  Patient Active Problem List   Diagnosis Date Noted   Accelerated hypertension 05/31/2012   Lightheadedness 05/31/2012   COPD (chronic obstructive pulmonary disease) 05/31/2012   Hyponatremia 05/31/2012   Past Medical History  Diagnosis Date   COPD (chronic obstructive pulmonary disease)    Past Surgical History  Procedure Laterality Date   Tonsillectomy     No Known Allergies Prior to Admission medications   Medication Sig Start Date End Date Taking? Authorizing Provider  albuterol (PROVENTIL HFA;VENTOLIN HFA) 108 (90 BASE) MCG/ACT inhaler Inhale 2 puffs into the lungs every 6 (six) hours as needed for wheezing or shortness of breath.   Yes Historical Provider, MD  Fluticasone-Salmeterol (ADVAIR) 100-50 MCG/DOSE AEPB Inhale 1 puff into the lungs every 12 (twelve) hours. 02/12/12  Yes Tonye Pearson, MD  diphenhydrAMINE (BENADRYL) 12.5 MG/5ML elixir Take 25 mg by mouth 4 (four) times daily as needed. For itching    Historical Provider, MD  lisinopril (PRINIVIL,ZESTRIL) 10 MG tablet Take 1 tablet (10 mg total) by mouth daily. 06/01/12 06/01/13  Dorothea Ogle, MD  mupirocin  ointment (BACTROBAN) 2 % Apply topically 3 (three) times daily. 07/21/12   Sherren Mocha, MD   Results for orders placed during the hospital encounter of 05/31/12  CBC WITH DIFFERENTIAL      Result Value Range   WBC 6.9  4.0 - 10.5 K/uL   RBC 4.31  3.87 - 5.11 MIL/uL   Hemoglobin 13.4  12.0 - 15.0 g/dL   HCT 78.2  95.6 - 21.3 %   MCV 90.5  78.0 - 100.0 fL   MCH 31.1  26.0 - 34.0 pg   MCHC 34.4  30.0 - 36.0 g/dL   RDW 08.6  57.8 - 46.9 %   Platelets 250  150 - 400 K/uL   Neutrophils Relative % 77  43 - 77 %   Neutro Abs 5.3  1.7 - 7.7 K/uL   Lymphocytes Relative 18  12 - 46 %   Lymphs Abs 1.3  0.7 - 4.0 K/uL   Monocytes Relative 4  3 - 12 %   Monocytes Absolute 0.3  0.1 - 1.0 K/uL   Eosinophils Relative 1  0 - 5 %   Eosinophils Absolute 0.1  0.0 - 0.7 K/uL   Basophils Relative 0  0 - 1 %   Basophils Absolute 0.0  0.0 - 0.1 K/uL  COMPREHENSIVE METABOLIC PANEL      Result Value Range   Sodium 131 (*) 135 - 145 mEq/L   Potassium 3.9  3.5 - 5.1 mEq/L   Chloride 95 (*) 96 - 112 mEq/L  CO2 23  19 - 32 mEq/L   Glucose, Bld 99  70 - 99 mg/dL   BUN 13  6 - 23 mg/dL   Creatinine, Ser 1.61  0.50 - 1.10 mg/dL   Calcium 9.7  8.4 - 09.6 mg/dL   Total Protein 7.2  6.0 - 8.3 g/dL   Albumin 4.4  3.5 - 5.2 g/dL   AST 25  0 - 37 U/L   ALT 15  0 - 35 U/L   Alkaline Phosphatase 74  39 - 117 U/L   Total Bilirubin 0.5  0.3 - 1.2 mg/dL   GFR calc non Af Amer 86 (*) >90 mL/min   GFR calc Af Amer >90  >90 mL/min  URINALYSIS, ROUTINE W REFLEX MICROSCOPIC      Result Value Range   Color, Urine YELLOW  YELLOW   APPearance CLEAR  CLEAR   Specific Gravity, Urine 1.012  1.005 - 1.030   pH 7.0  5.0 - 8.0   Glucose, UA NEGATIVE  NEGATIVE mg/dL   Hgb urine dipstick NEGATIVE  NEGATIVE   Bilirubin Urine NEGATIVE  NEGATIVE   Ketones, ur NEGATIVE  NEGATIVE mg/dL   Protein, ur NEGATIVE  NEGATIVE mg/dL   Urobilinogen, UA 0.2  0.0 - 1.0 mg/dL   Nitrite NEGATIVE  NEGATIVE   Leukocytes, UA NEGATIVE  NEGATIVE    TROPONIN I      Result Value Range   Troponin I <0.30  <0.30 ng/mL  TSH      Result Value Range   TSH 0.990  0.350 - 4.500 uIU/mL  CARDIAC PANEL(CRET KIN+CKTOT+MB+TROPI)      Result Value Range   Total CK 76  7 - 177 U/L   CK, MB 3.6  0.3 - 4.0 ng/mL   Troponin I <0.30  <0.30 ng/mL   Relative Index RELATIVE INDEX IS INVALID  0.0 - 2.5  CARDIAC PANEL(CRET KIN+CKTOT+MB+TROPI)      Result Value Range   Total CK 69  7 - 177 U/L   CK, MB 3.2  0.3 - 4.0 ng/mL   Troponin I <0.30  <0.30 ng/mL   Relative Index RELATIVE INDEX IS INVALID  0.0 - 2.5  CBC      Result Value Range   WBC 6.2  4.0 - 10.5 K/uL   RBC 3.55 (*) 3.87 - 5.11 MIL/uL   Hemoglobin 11.1 (*) 12.0 - 15.0 g/dL   HCT 04.5 (*) 40.9 - 81.1 %   MCV 88.5  78.0 - 100.0 fL   MCH 31.3  26.0 - 34.0 pg   MCHC 35.4  30.0 - 36.0 g/dL   RDW 91.4  78.2 - 95.6 %   Platelets 219  150 - 400 K/uL  BASIC METABOLIC PANEL      Result Value Range   Sodium 130 (*) 135 - 145 mEq/L   Potassium 3.8  3.5 - 5.1 mEq/L   Chloride 99  96 - 112 mEq/L   CO2 23  19 - 32 mEq/L   Glucose, Bld 99  70 - 99 mg/dL   BUN 9  6 - 23 mg/dL   Creatinine, Ser 2.13  0.50 - 1.10 mg/dL   Calcium 8.7  8.4 - 08.6 mg/dL   GFR calc non Af Amer >90  >90 mL/min   GFR calc Af Amer >90  >90 mL/min    Review of Systems  Respiratory: Negative for cough, chest tightness and shortness of breath.   Cardiovascular: Negative for chest pain.  Neurological: Negative for  light-headedness.  Psychiatric/Behavioral: Negative for dysphoric mood. The patient is not nervous/anxious.       Objective:   Physical Exam  Nursing note and vitals reviewed. Constitutional: She is oriented to person, place, and time. She appears well-developed and well-nourished. No distress.  HENT:  Head: Normocephalic and atraumatic.  Right Ear: External ear normal.  Left Ear: External ear normal.  Eyes: EOM are normal.  Neck: Neck supple. No tracheal deviation present.  Cardiovascular: Normal  rate, regular rhythm and normal heart sounds.  Exam reveals no gallop and no friction rub.   No murmur heard. Pulmonary/Chest: Effort normal and breath sounds normal. No respiratory distress. She has no wheezes. She has no rales.  Musculoskeletal: Normal range of motion.  Neurological: She is alert and oriented to person, place, and time.  Skin: Skin is warm and dry.  Psychiatric: She has a normal mood and affect. Her behavior is normal.    BP 158/70   Pulse 67   Temp(Src) 98.5 F (36.9 C) (Oral)   Resp 16   Ht 5' (1.524 m)   Wt 130 lb (58.968 kg)   BMI 25.39 kg/m2   SpO2 98%    Assessment & Plan:   Asthma - Plan: albuterol (PROVENTIL HFA;VENTOLIN HFA) 108 (90 BASE) MCG/ACT inhaler, Fluticasone-Salmeterol (ADVAIR) 100-50 MCG/DOSE AEPB, oseltamivir (TAMIFLU) 75 MG capsule    I personally performed the services described in this documentation, which was scribed in my presence. The recorded information has been reviewed and is accurate.

## 2013-09-16 ENCOUNTER — Telehealth: Payer: Self-pay

## 2013-09-16 NOTE — Telephone Encounter (Signed)
Left message, which medications?

## 2013-09-16 NOTE — Telephone Encounter (Signed)
Patient called to ask Amy L to remove these mediations from her chart: Benadryl, lisinopril, and Bactrum (some type of ointment) Patient states that she has never taken any of these medications before in her life and wants them removed from her medical chart. She noticed it on her after visit summary report.   Best number for any questions: [phone]

## 2013-09-16 NOTE — Telephone Encounter (Signed)
PT STATES SHE WAS LOOKING OVER HER CHART AND SAW 3 DIFFERENT MEDICINES THAT SHE DOESN'T TAKE AND HAVE NEVER TAKEN. NEED TO SPEAK WITH SOMEONE AND HAVE IT REMOVED. PLEASE CALL [phone]

## 2013-09-18 NOTE — Telephone Encounter (Signed)
Spoke to patient, this is done.

## 2013-09-18 NOTE — Telephone Encounter (Signed)
Thanks these have been removed. Advised patient at each visit these should be updated, apologized this was not done properly at the visit.

## 2014-06-22 ENCOUNTER — Telehealth: Payer: Self-pay | Admitting: *Deleted

## 2014-06-22 NOTE — Telephone Encounter (Signed)
Phoned patient & scheduled Annual Wellness Visit---states she has phobias to needles, doesn't want mammo nor colonoscopy, but will come in for OV because insurance co requires it.

## 2014-07-29 ENCOUNTER — Encounter: Payer: Self-pay | Admitting: Family Medicine

## 2014-09-02 ENCOUNTER — Encounter: Payer: Self-pay | Admitting: Family Medicine

## 2014-09-02 ENCOUNTER — Ambulatory Visit (INDEPENDENT_AMBULATORY_CARE_PROVIDER_SITE_OTHER): Payer: Medicare Other | Admitting: Family Medicine

## 2014-09-02 VITALS — BP 147/71 | HR 68 | Temp 97.8°F | Resp 16 | Ht 60.0 in | Wt 124.0 lb

## 2014-09-02 DIAGNOSIS — J4530 Mild persistent asthma, uncomplicated: Secondary | ICD-10-CM

## 2014-09-02 MED ORDER — FLUTICASONE-SALMETEROL 100-50 MCG/DOSE IN AEPB: 1.0000 | INHALATION_SPRAY | Freq: Two times a day (BID) | RESPIRATORY_TRACT | Status: DC

## 2014-09-02 MED ORDER — OSELTAMIVIR PHOSPHATE 75 MG PO CAPS: 75.0000 mg | ORAL_CAPSULE | Freq: Two times a day (BID) | ORAL | Status: DC

## 2014-09-02 MED ORDER — ALBUTEROL SULFATE HFA 108 (90 BASE) MCG/ACT IN AERS: 2.0000 | INHALATION_SPRAY | Freq: Four times a day (QID) | RESPIRATORY_TRACT | Status: DC | PRN

## 2014-09-02 NOTE — Progress Notes (Signed)
   Subjective:    Patient ID: Cheryl Olson, female    DOB: 12/19/1940, 73 y.o.   MRN: [id number] This chart was scribed for Elvina SidleKurt , MD by Littie Deedsichard Sun, Medical Scribe. This patient was seen in Room 24 and the patient's care was started at 11:33 AM.   HPI HPI Comments: Cheryl Olson is a 73 y.o. female with a hx of COPD who presents to the [facility] complaining of follow-up and medication refill. Patient has had cataracts surgery. She has been using Advair as needed. Patient denies depression, trouble with falls, and sleep difficulty. She does not get colonoscopies nor mammograms.  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  Review of Systems  Constitutional: Negative for fever.  HENT: Negative for ear discharge.   Eyes: Negative for discharge.  Cardiovascular: Negative for chest pain.  Gastrointestinal: Negative for diarrhea.  Genitourinary: Negative for hematuria.  Musculoskeletal: Negative for back pain.  Skin: Negative for rash.  Neurological: Negative for seizures.  Psychiatric/Behavioral: Negative for sleep disturbance and dysphoric mood.       Objective:   Physical Exam CONSTITUTIONAL: Well developed/well nourished HEAD: Normocephalic/atraumatic EYES: EOM/PERRL ENMT: Mucous membranes moist. Cerumen buildup in left ear. No cerumen buildup in right ear. NECK: supple no meningeal signs SPINE: entire spine nontender CV: S1/S2 noted, no murmurs/rubs/gallops noted LUNGS: Lungs are clear to auscultation bilaterally, no apparent distress ABDOMEN: soft, nontender, no rebound or guarding GU: no cva tenderness NEURO: Pt is awake/alert, moves all extremitiesx4 EXTREMITIES: pulses normal, full ROM SKIN: warm, color normal PSYCH: no abnormalities of mood noted      Assessment & Plan:   Asthma, mild persistent, uncomplicated - Plan: oseltamivir  (TAMIFLU) 75 MG capsule, Fluticasone-Salmeterol (ADVAIR) 100-50 MCG/DOSE AEPB, albuterol (PROVENTIL HFA;VENTOLIN HFA) 108 (90 BASE) MCG/ACT inhaler  Signed, Elvina SidleKurt , MD

## 2014-10-04 ENCOUNTER — Telehealth: Payer: Self-pay

## 2014-10-04 NOTE — Telephone Encounter (Signed)
LMVM for pt to come in to have her flu shot.

## 2014-12-31 ENCOUNTER — Ambulatory Visit (INDEPENDENT_AMBULATORY_CARE_PROVIDER_SITE_OTHER): Payer: Medicare Other | Admitting: Family Medicine

## 2014-12-31 VITALS — BP 160/68 | HR 60 | Temp 97.9°F | Resp 18 | Ht 60.0 in | Wt 124.2 lb

## 2014-12-31 DIAGNOSIS — H6122 Impacted cerumen, left ear: Secondary | ICD-10-CM

## 2014-12-31 NOTE — Progress Notes (Addendum)
° °  Subjective:    Patient ID: Cheryl Olson, female    DOB: 06/28/1941, 74 y.o.   MRN: [id number]  This chart was scribed for Cheryl SidleKurt Lauenstein, MD, by Ronney LionSuzanne Olson, ED Scribe. This patient was seen in room 9 and the patient's care was started at 11:14 AM.   Chief Complaint  Patient presents with   Hearing Loss    Both ears. Pt wants ears cleaned    HPI HPI Comments: Cheryl CraftsCarol Noel Ferger is a 74 y.o. female who presents to the [facility] complaining of hearing loss, left greater than left. She states she would like both her ears cleaned.  Patient works for [employer].   Review of Systems  HENT: Positive for hearing loss.        Objective:   Physical Exam  Constitutional: She is oriented to person, place, and time. She appears well-developed and well-nourished. No distress.  HENT:  Head: Normocephalic and atraumatic.  Left cerumen impaction.  Lavaged and patient hearing restored  Eyes: Conjunctivae and EOM are normal.  Neck: Neck supple. No tracheal deviation present.  Cardiovascular: Normal rate.   Pulmonary/Chest: Effort normal. No respiratory distress.  Musculoskeletal: Normal range of motion.  Neurological: She is alert and oriented to person, place, and time.  Skin: Skin is warm and dry.  Psychiatric: She has a normal mood and affect. Her behavior is normal.  Nursing note and vitals reviewed.  Blood Pressure Taken Manually with Sphygmomanometer by Dr. Milus GlazierLauenstein, with Normal Cuff 120/62 Ear lavaged clear    Assessment & Plan:   This chart was scribed in my presence and reviewed by me personally.    ICD-9-CM ICD-10-CM   1. Cerumen impaction, left 380.4 H61.22      Signed, Cheryl SidleKurt Lauenstein, MD   Signed, Cheryl SidleKurt Lauenstein, MD

## 2015-01-25 ENCOUNTER — Encounter: Payer: Self-pay | Admitting: *Deleted

## 2015-06-02 ENCOUNTER — Emergency Department (HOSPITAL_COMMUNITY)
Admission: EM | Admit: 2015-06-02 | Discharge: 2015-06-02 | Disposition: A | Discharge status: Home / Self Care | Payer: Medicare Other | Attending: Emergency Medicine | Admitting: Emergency Medicine

## 2015-06-02 ENCOUNTER — Encounter (HOSPITAL_COMMUNITY): Payer: Self-pay

## 2015-06-02 ENCOUNTER — Emergency Department (HOSPITAL_COMMUNITY): Payer: Medicare Other

## 2015-06-02 DIAGNOSIS — Y998 Other external cause status: Secondary | ICD-10-CM | POA: Diagnosis not present

## 2015-06-02 DIAGNOSIS — S0081XA Abrasion of other part of head, initial encounter: Secondary | ICD-10-CM | POA: Insufficient documentation

## 2015-06-02 DIAGNOSIS — Z87891 Personal history of nicotine dependence: Secondary | ICD-10-CM | POA: Diagnosis not present

## 2015-06-02 DIAGNOSIS — Y9289 Other specified places as the place of occurrence of the external cause: Secondary | ICD-10-CM | POA: Insufficient documentation

## 2015-06-02 DIAGNOSIS — S9002XA Contusion of left ankle, initial encounter: Secondary | ICD-10-CM | POA: Diagnosis not present

## 2015-06-02 DIAGNOSIS — Z79899 Other long term (current) drug therapy: Secondary | ICD-10-CM | POA: Insufficient documentation

## 2015-06-02 DIAGNOSIS — S93402A Sprain of unspecified ligament of left ankle, initial encounter: Secondary | ICD-10-CM | POA: Diagnosis not present

## 2015-06-02 DIAGNOSIS — S60512A Abrasion of left hand, initial encounter: Secondary | ICD-10-CM | POA: Insufficient documentation

## 2015-06-02 DIAGNOSIS — Y9389 Activity, other specified: Secondary | ICD-10-CM | POA: Diagnosis not present

## 2015-06-02 DIAGNOSIS — S99912A Unspecified injury of left ankle, initial encounter: Secondary | ICD-10-CM | POA: Diagnosis present

## 2015-06-02 DIAGNOSIS — J449 Chronic obstructive pulmonary disease, unspecified: Secondary | ICD-10-CM | POA: Diagnosis not present

## 2015-06-02 DIAGNOSIS — Z7951 Long term (current) use of inhaled steroids: Secondary | ICD-10-CM | POA: Insufficient documentation

## 2015-06-02 DIAGNOSIS — W19XXXA Unspecified fall, initial encounter: Secondary | ICD-10-CM

## 2015-06-02 DIAGNOSIS — Z7982 Long term (current) use of aspirin: Secondary | ICD-10-CM | POA: Insufficient documentation

## 2015-06-02 DIAGNOSIS — S60511A Abrasion of right hand, initial encounter: Secondary | ICD-10-CM | POA: Diagnosis not present
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
## 2015-06-02 NOTE — Discharge Instructions (Signed)
Take Tylenol or Motrin as needed for pain control. Keep your ankle compressed and a dressing and elevated while at rest. Weight-bear as tolerated. Return without fail for worsening symptoms, including worsening headaches, vision changes, speech changes, confusion, difficulty with ambulation, numbness or weakness, or any other symptoms concerning to you.  Ankle Sprain An ankle sprain is an injury to the strong, fibrous tissues (ligaments) that hold the bones of your ankle joint together.  CAUSES An ankle sprain is usually caused by a fall or by twisting your ankle. Ankle sprains most commonly occur when you step on the outer edge of your foot, and your ankle turns inward. People who participate in sports are more prone to these types of injuries.  SYMPTOMS   Pain in your ankle. The pain may be present at rest or only when you are trying to stand or walk.  Swelling.  Bruising. Bruising may develop immediately or within 1 to 2 days after your injury.  Difficulty standing or walking, particularly when turning corners or changing directions. DIAGNOSIS  Your caregiver will ask you details about your injury and perform a physical exam of your ankle to determine if you have an ankle sprain. During the physical exam, your caregiver will press on and apply pressure to specific areas of your foot and ankle. Your caregiver will try to move your ankle in certain ways. An X-ray exam may be done to be sure a bone was not broken or a ligament did not separate from one of the bones in your ankle (avulsion fracture).  TREATMENT  Certain types of braces can help stabilize your ankle. Your caregiver can make a recommendation for this. Your caregiver may recommend the use of medicine for pain. If your sprain is severe, your caregiver may refer you to a surgeon who helps to restore function to parts of your skeletal system (orthopedist) or a physical therapist. HOME CARE INSTRUCTIONS   Apply ice to your injury for  1-2 days or as directed by your caregiver. Applying ice helps to reduce inflammation and pain.  Put ice in a plastic bag.  Place a towel between your skin and the bag.  Leave the ice on for 15-20 minutes at a time, every 2 hours while you are awake.  Only take over-the-counter or prescription medicines for pain, discomfort, or fever as directed by your caregiver.  Elevate your injured ankle above the level of your heart as much as possible for 2-3 days.  If your caregiver recommends crutches, use them as instructed. Gradually put weight on the affected ankle. Continue to use crutches or a cane until you can walk without feeling pain in your ankle.  If you have a plaster splint, wear the splint as directed by your caregiver. Do not rest it on anything harder than a pillow for the first 24 hours. Do not put weight on it. Do not get it wet. You may take it off to take a shower or bath.  You may have been given an elastic bandage to wear around your ankle to provide support. If the elastic bandage is too tight (you have numbness or tingling in your foot or your foot becomes cold and blue), adjust the bandage to make it comfortable.  If you have an air splint, you may blow more air into it or let air out to make it more comfortable. You may take your splint off at night and before taking a shower or bath. Wiggle your toes in the splint several  times per day to decrease swelling. SEEK MEDICAL CARE IF:   You have rapidly increasing bruising or swelling.  Your toes feel extremely cold or you lose feeling in your foot.  Your pain is not relieved with medicine. SEEK IMMEDIATE MEDICAL CARE IF:  Your toes are numb or blue.  You have severe pain that is increasing. MAKE SURE YOU:   Understand these instructions.  Will watch your condition.  Will get help right away if you are not doing well or get worse. Document Released: 10/08/2005 Document Revised: 07/02/2012 Document Reviewed:  10/20/2011 [facility] Patient Information 2015 [place], [place]. This information is not intended to replace advice given to you by your health care provider. Make sure you discuss any questions you have with your health care provider.
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
## 2015-07-07 DIAGNOSIS — H35341 Macular cyst, hole, or pseudohole, right eye: Secondary | ICD-10-CM | POA: Diagnosis not present

## 2015-07-07 DIAGNOSIS — H5201 Hypermetropia, right eye: Secondary | ICD-10-CM | POA: Diagnosis not present

## 2015-07-07 DIAGNOSIS — H35373 Puckering of macula, bilateral: Secondary | ICD-10-CM | POA: Diagnosis not present

## 2015-07-07 DIAGNOSIS — H43813 Vitreous degeneration, bilateral: Secondary | ICD-10-CM | POA: Diagnosis not present

## 2015-07-26 ENCOUNTER — Encounter: Payer: Self-pay | Admitting: Emergency Medicine

## 2015-10-07 DIAGNOSIS — H35341 Macular cyst, hole, or pseudohole, right eye: Secondary | ICD-10-CM | POA: Diagnosis not present

## 2016-01-04 ENCOUNTER — Ambulatory Visit (INDEPENDENT_AMBULATORY_CARE_PROVIDER_SITE_OTHER): Payer: Medicare Other | Admitting: Family Medicine

## 2016-01-04 VITALS — BP 120/76 | HR 73 | Temp 98.0°F | Resp 17 | Ht 61.0 in | Wt 123.0 lb

## 2016-01-04 DIAGNOSIS — J4530 Mild persistent asthma, uncomplicated: Secondary | ICD-10-CM | POA: Diagnosis not present

## 2016-01-04 MED ORDER — ALBUTEROL SULFATE HFA 108 (90 BASE) MCG/ACT IN AERS: 2.0000 | INHALATION_SPRAY | Freq: Four times a day (QID) | RESPIRATORY_TRACT | Status: DC | PRN

## 2016-01-04 MED ORDER — FLUTICASONE-SALMETEROL 100-50 MCG/DOSE IN AEPB: 1.0000 | INHALATION_SPRAY | Freq: Two times a day (BID) | RESPIRATORY_TRACT | Status: DC
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
## 2016-04-06 DIAGNOSIS — H35373 Puckering of macula, bilateral: Secondary | ICD-10-CM | POA: Diagnosis not present

## 2016-05-12 DIAGNOSIS — L255 Unspecified contact dermatitis due to plants, except food: Secondary | ICD-10-CM | POA: Diagnosis not present

## 2016-07-20 DIAGNOSIS — H35373 Puckering of macula, bilateral: Secondary | ICD-10-CM | POA: Diagnosis not present

## 2016-07-20 DIAGNOSIS — H43813 Vitreous degeneration, bilateral: Secondary | ICD-10-CM | POA: Diagnosis not present

## 2017-11-14 ENCOUNTER — Encounter (HOSPITAL_COMMUNITY): Payer: Self-pay | Admitting: Emergency Medicine

## 2017-11-14 ENCOUNTER — Emergency Department (HOSPITAL_COMMUNITY)
Admission: EM | Admit: 2017-11-14 | Discharge: 2017-11-14 | Disposition: A | Discharge status: Home / Self Care | Payer: Medicare Other | Attending: Emergency Medicine | Admitting: Emergency Medicine

## 2017-11-14 ENCOUNTER — Emergency Department (HOSPITAL_COMMUNITY): Payer: Medicare Other

## 2017-11-14 DIAGNOSIS — R404 Transient alteration of awareness: Secondary | ICD-10-CM | POA: Diagnosis not present

## 2017-11-14 DIAGNOSIS — R55 Syncope and collapse: Secondary | ICD-10-CM | POA: Diagnosis not present

## 2017-11-14 DIAGNOSIS — R1111 Vomiting without nausea: Secondary | ICD-10-CM | POA: Diagnosis not present

## 2017-11-14 DIAGNOSIS — Z79899 Other long term (current) drug therapy: Secondary | ICD-10-CM | POA: Insufficient documentation

## 2017-11-14 DIAGNOSIS — J449 Chronic obstructive pulmonary disease, unspecified: Secondary | ICD-10-CM | POA: Diagnosis not present

## 2017-11-14 DIAGNOSIS — R1012 Left upper quadrant pain: Secondary | ICD-10-CM | POA: Diagnosis not present

## 2017-11-14 DIAGNOSIS — R109 Unspecified abdominal pain: Secondary | ICD-10-CM | POA: Diagnosis not present

## 2017-11-14 DIAGNOSIS — R569 Unspecified convulsions: Secondary | ICD-10-CM | POA: Diagnosis not present

## 2017-11-14 DIAGNOSIS — Z7982 Long term (current) use of aspirin: Secondary | ICD-10-CM | POA: Diagnosis not present

## 2017-11-14 DIAGNOSIS — Z87891 Personal history of nicotine dependence: Secondary | ICD-10-CM | POA: Diagnosis not present

## 2017-11-14 DIAGNOSIS — I10 Essential (primary) hypertension: Secondary | ICD-10-CM | POA: Diagnosis not present

## 2017-11-14 LAB — HEPATIC FUNCTION PANEL
ALT: 16 U/L (ref 14–54)
AST: 35 U/L (ref 15–41)
Albumin: 3.8 g/dL (ref 3.5–5.0)
Alkaline Phosphatase: 58 U/L (ref 38–126)
BILIRUBIN INDIRECT: 0.7 mg/dL (ref 0.3–0.9)
BILIRUBIN TOTAL: 1.1 mg/dL (ref 0.3–1.2)
Bilirubin, Direct: 0.4 mg/dL (ref 0.1–0.5)
Total Protein: 6.3 g/dL — ABNORMAL LOW (ref 6.5–8.1)

## 2017-11-14 LAB — BASIC METABOLIC PANEL
ANION GAP: 10 (ref 5–15)
BUN: 24 mg/dL — ABNORMAL HIGH (ref 6–20)
CO2: 24 mmol/L (ref 22–32)
Calcium: 8.9 mg/dL (ref 8.9–10.3)
Chloride: 104 mmol/L (ref 101–111)
Creatinine, Ser: 0.94 mg/dL (ref 0.44–1.00)
GFR calc Af Amer: >60 mL/min (ref >60)
GFR calc non Af Amer: 57 mL/min — ABNORMAL LOW (ref >60)
GLUCOSE: 106 mg/dL — AB (ref 65–99)
POTASSIUM: 5.1 mmol/L (ref 3.5–5.1)
Sodium: 138 mmol/L (ref 135–145)

## 2017-11-14 LAB — I-STAT TROPONIN, ED: Troponin i, poc: 0 ng/mL (ref 0.00–0.08)

## 2017-11-14 LAB — LIPASE, BLOOD: Lipase: 40 U/L (ref 11–51)

## 2017-11-14 LAB — CBG MONITORING, ED: Glucose-Capillary: 100 mg/dL — ABNORMAL HIGH (ref 65–99)

## 2017-11-14 LAB — CBC
HEMATOCRIT: 42 % (ref 36.0–46.0)
HEMOGLOBIN: 14 g/dL (ref 12.0–15.0)
MCH: 30.8 pg (ref 26.0–34.0)
MCHC: 33.3 g/dL (ref 30.0–36.0)
MCV: 92.3 fL (ref 78.0–100.0)
Platelets: 258 10*3/uL (ref 150–400)
RBC: 4.55 MIL/uL (ref 3.87–5.11)
RDW: 13.4 % (ref 11.5–15.5)
WBC: 10.3 10*3/uL (ref 4.0–10.5)

## 2017-11-14 LAB — URINALYSIS, ROUTINE W REFLEX MICROSCOPIC
BACTERIA UA: NONE SEEN
BILIRUBIN URINE: NEGATIVE
GLUCOSE, UA: NEGATIVE mg/dL
Ketones, ur: 20 mg/dL — AB
Leukocytes, UA: NEGATIVE
NITRITE: NEGATIVE
PROTEIN: NEGATIVE mg/dL
Specific Gravity, Urine: 1.02 (ref 1.005–1.030)
pH: 5 (ref 5.0–8.0)

## 2017-11-14 LAB — I-STAT CG4 LACTIC ACID, ED: Lactic Acid, Venous: 0.94 mmol/L (ref 0.5–1.9)

## 2017-11-14 LAB — PROTIME-INR
INR: 1.02
Prothrombin Time: 13.4 seconds (ref 11.4–15.2)

## 2017-11-14 MED ORDER — FLUTICASONE-SALMETEROL 100-50 MCG/DOSE IN AEPB: 1.0000 | INHALATION_SPRAY | Freq: Two times a day (BID) | RESPIRATORY_TRACT | 1 refills | Status: DC

## 2017-11-14 MED ORDER — SODIUM CHLORIDE 0.9 % IV SOLN
Freq: Once | INTRAVENOUS | Status: AC
  Administered 2017-11-14: 16:00:00 via INTRAVENOUS

## 2017-11-14 MED ORDER — DOCUSATE SODIUM 100 MG PO CAPS: 100.0000 mg | ORAL_CAPSULE | Freq: Two times a day (BID) | ORAL | 0 refills | Status: DC

## 2017-11-14 MED ORDER — ALBUTEROL SULFATE HFA 108 (90 BASE) MCG/ACT IN AERS: 1.0000 | INHALATION_SPRAY | Freq: Four times a day (QID) | RESPIRATORY_TRACT | 0 refills | Status: DC | PRN

## 2017-11-14 MED ORDER — OMEPRAZOLE 20 MG PO CPDR: 20.0000 mg | DELAYED_RELEASE_CAPSULE | Freq: Every day | ORAL | 0 refills | Status: DC

## 2017-11-14 NOTE — ED Notes (Signed)
Refused [facility] at this time
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
## 2017-11-15 LAB — URINE CULTURE

## 2018-05-16 DIAGNOSIS — J069 Acute upper respiratory infection, unspecified: Secondary | ICD-10-CM | POA: Diagnosis not present

## 2018-10-04 IMAGING — CT CT RENAL STONE PROTOCOL
2 of 4 series · 16 of 46 positions shown, 18 images · non-contrast
Comparison: None

CLINICAL DATA: LEFT flank pain for 1 day with nausea and vomiting,
former smoker, COPD

EXAM:
CT ABDOMEN AND PELVIS WITHOUT CONTRAST
TECHNIQUE: Multidetector CT imaging of the abdomen and pelvis was performed
following the standard protocol without IV contrast. Sagittal and
coronal MPR images reconstructed from axial data set. Oral contrast
not administered for this indication.

[Series 3: ap without · axial · non-contrast · 0.65mm/px · z∈[+790,+1150]mm · 13 of 84 slices shown, 15 images]
[im 6/84  soft-tissue]
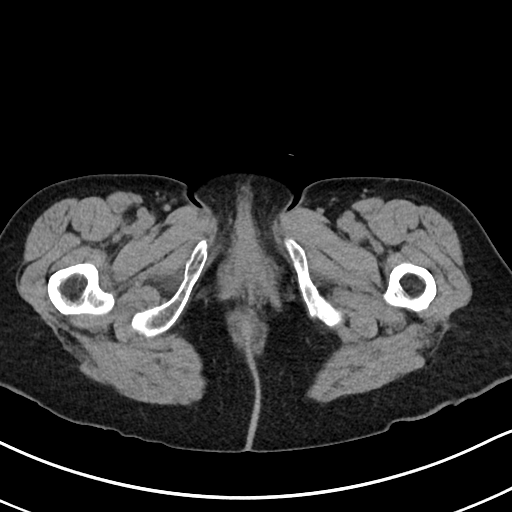
[im 6/84  bone]
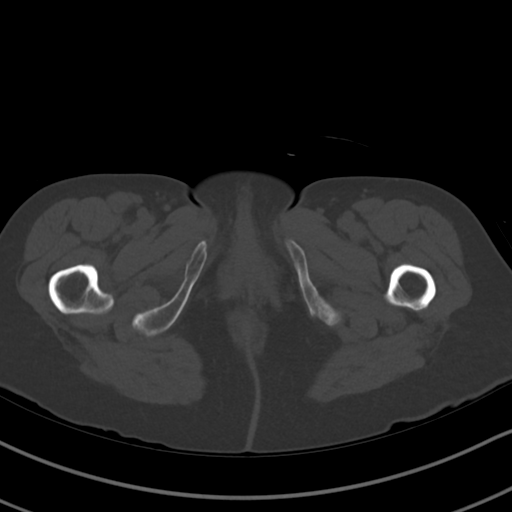
[im 11/84  soft-tissue]
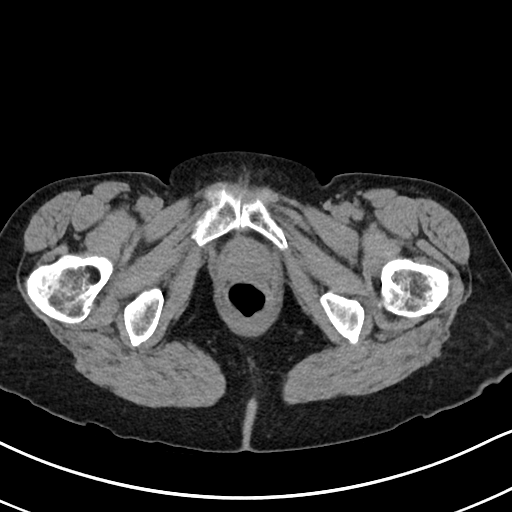
[im 16/84  soft-tissue]
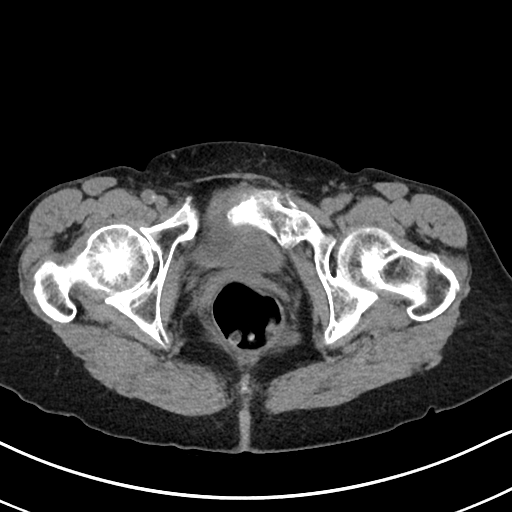
[im 26/84  soft-tissue]
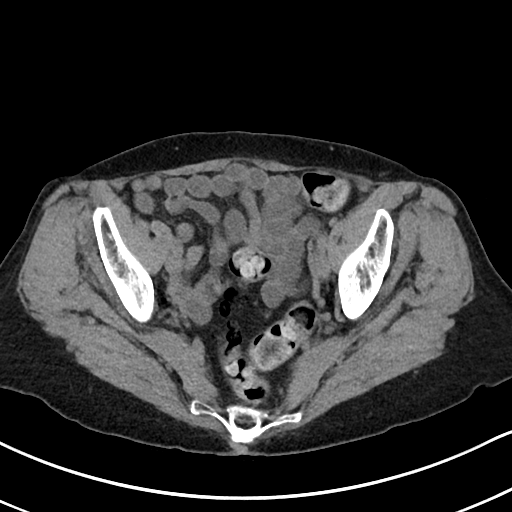
[im 32/84  soft-tissue]
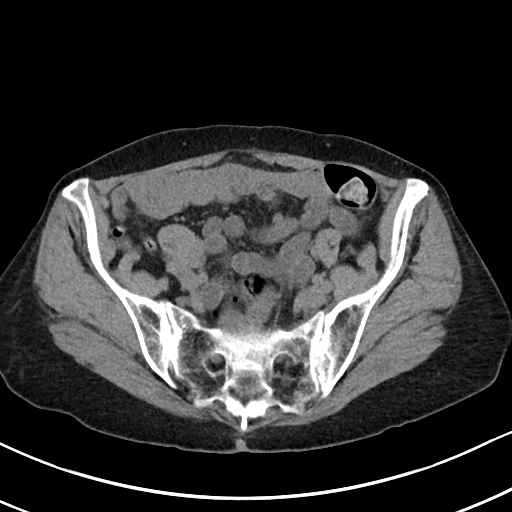
[im 37/84  soft-tissue]
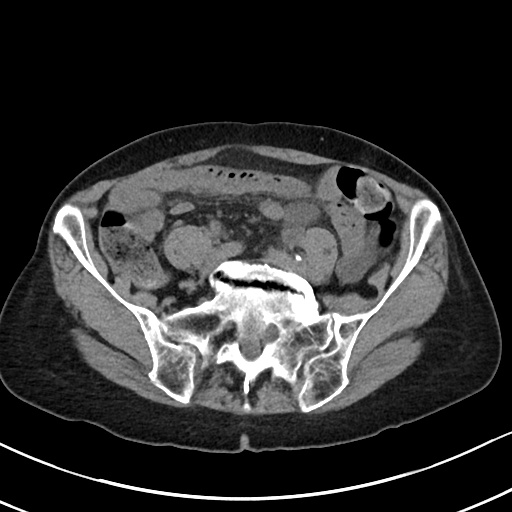
[im 42/84  soft-tissue]
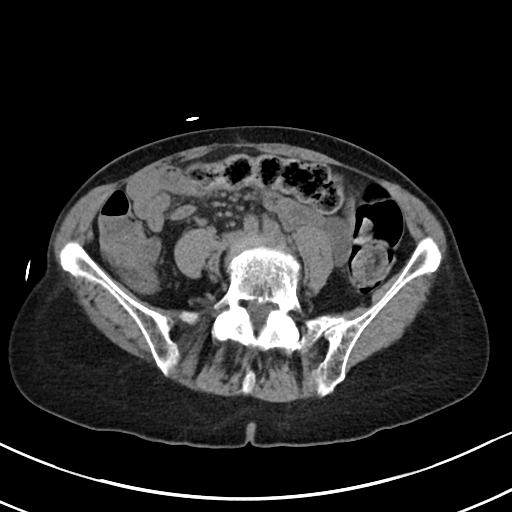
[im 47/84  soft-tissue]
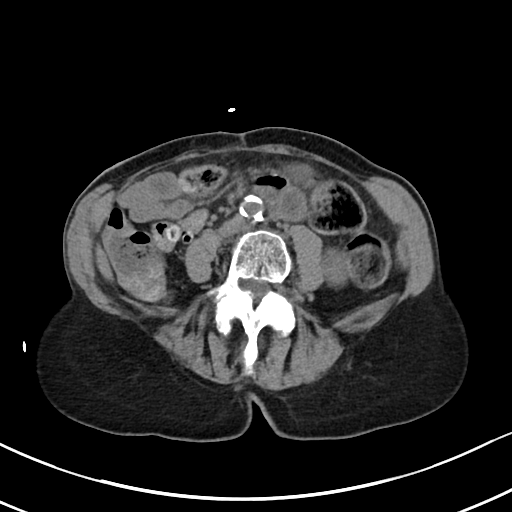
[im 52/84  soft-tissue]
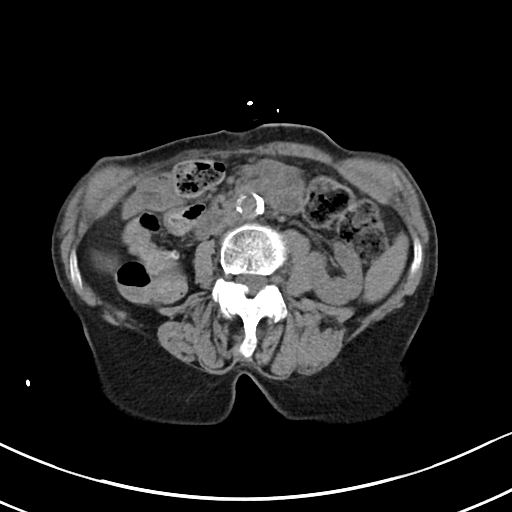
[im 52/84  bone]
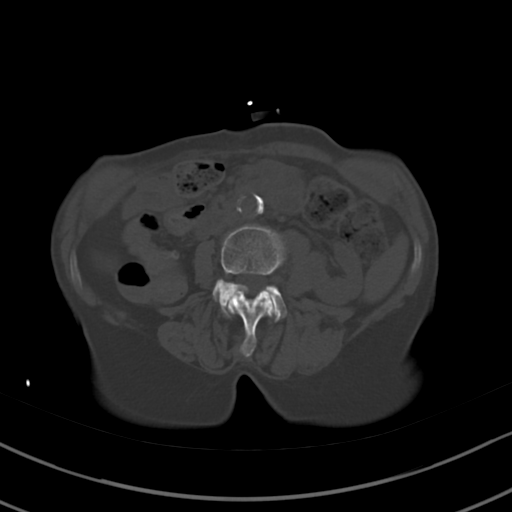
[im 58/84  soft-tissue]
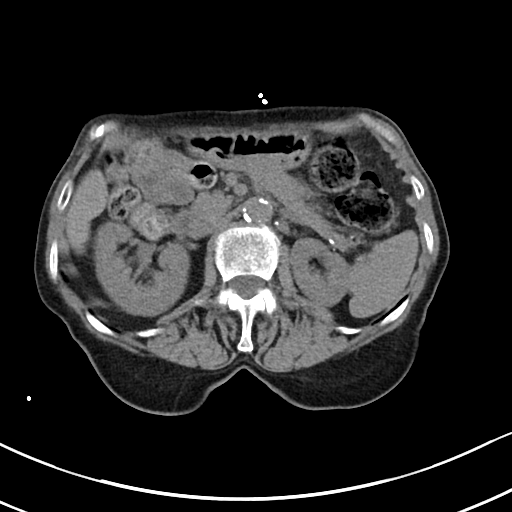
[im 68/84  soft-tissue]
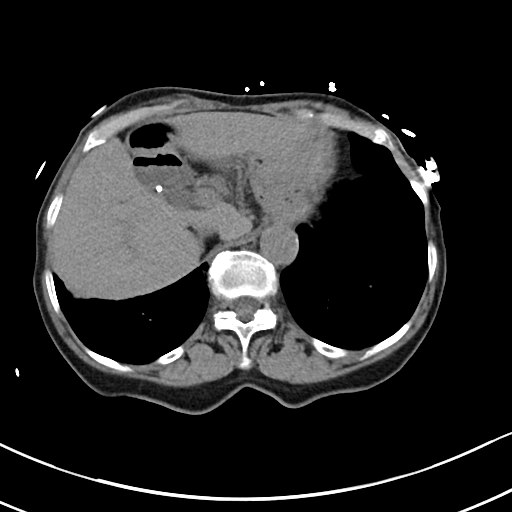
[im 73/84  soft-tissue]
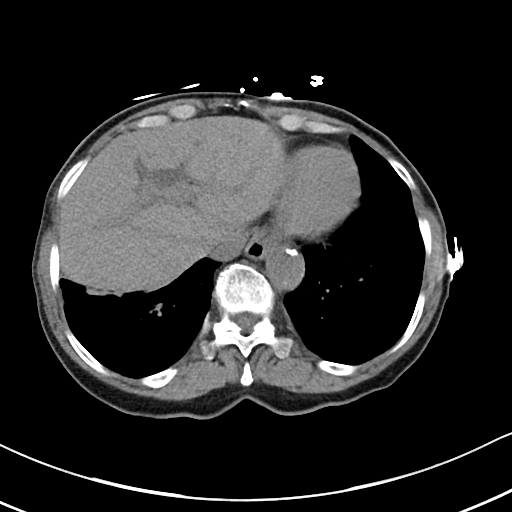
[im 78/84  soft-tissue]
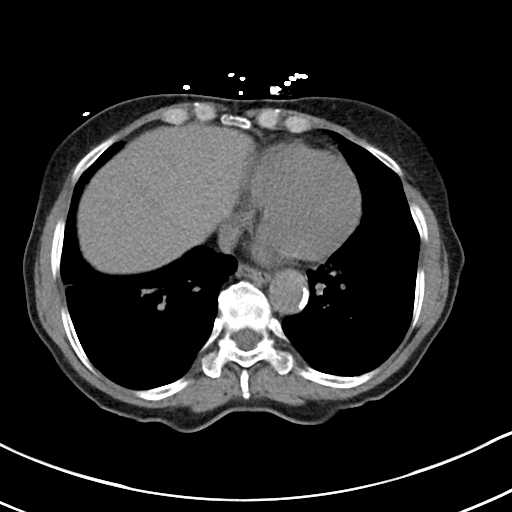

[Series 6: cor · coronal · 0.68mm/px · 3 of 89 slices shown]
[im 30/89  soft-tissue]
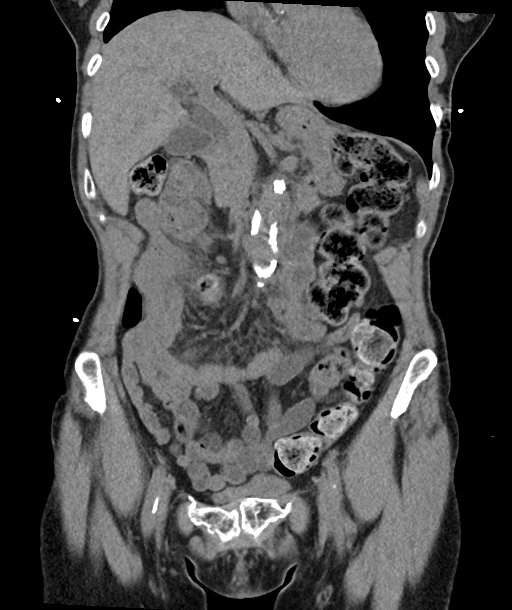
[im 40/89  soft-tissue]
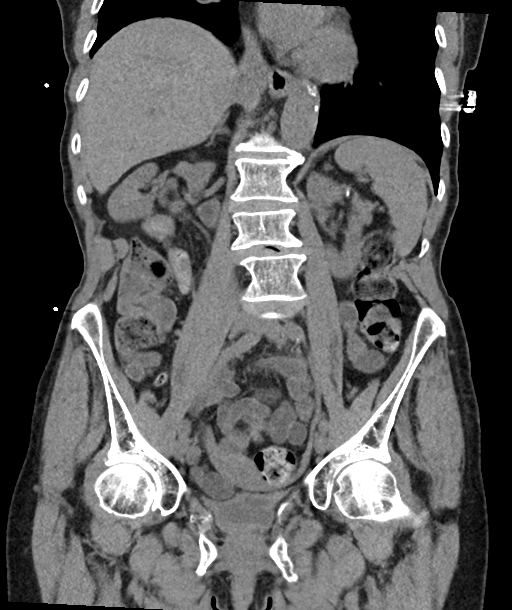
[im 49/89  soft-tissue]
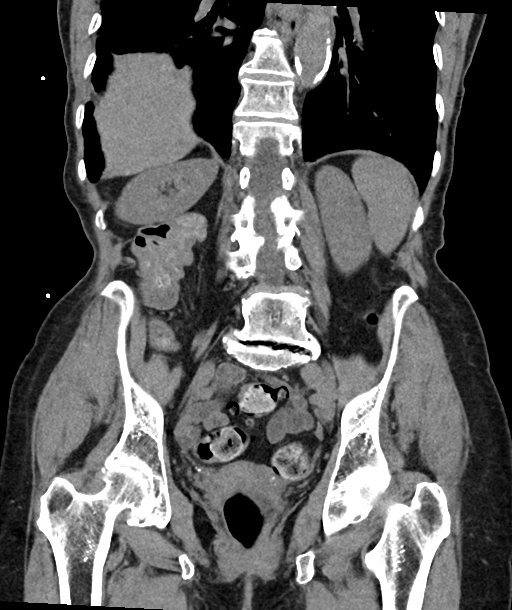

[16 of 46 positions shown; findings below may reference images not displayed]

FINDINGS: Lower chest: Peribronchial thickening and mild emphysematous changes
at lung bases. Subsegmental atelectasis RIGHT lung base. No basilar
infiltrate or effusion.

Hepatobiliary: Gallbladder surgically absent.  Liver unremarkable.

Pancreas: Atrophic pancreas without gross mass

Spleen: Normal appearance

Adrenals/Urinary Tract: Adrenal glands normal appearance. Kidneys,
ureters, and bladder normal appearance

Stomach/Bowel: Stool throughout colon. Scattered uncomplicated RIGHT
colonic diverticula. Normal appendix. Stomach and bowel loops
otherwise grossly unremarkable

Vascular/Lymphatic: Atherosclerotic calcifications aorta without
aneurysm. No definite adenopathy.

Reproductive: Unremarkable uterus. Normal appearing LEFT ovary.
RIGHT ovary not definitely visualized.

Other: No free air or free fluid. No hernia or acute inflammatory
process.

Musculoskeletal: Diffuse osseous demineralization. Old fractures of
the LEFT superior and inferior pubic rami. Degenerative changes of
the lower lumbar spine.
IMPRESSION: No definite acute intra-abdominal or intrapelvic abnormalities.

Scattered RIGHT diverticulosis without evidence of diverticulitis.

Aortic Atherosclerosis (ZXGC9-Y95.5) and Emphysema (ZXGC9-ZPR.V).

## 2018-12-08 DIAGNOSIS — R6883 Chills (without fever): Secondary | ICD-10-CM | POA: Diagnosis not present

## 2018-12-08 DIAGNOSIS — J069 Acute upper respiratory infection, unspecified: Secondary | ICD-10-CM | POA: Diagnosis not present

## 2018-12-08 DIAGNOSIS — R05 Cough: Secondary | ICD-10-CM | POA: Diagnosis not present

## 2018-12-08 DIAGNOSIS — Z760 Encounter for issue of repeat prescription: Secondary | ICD-10-CM | POA: Diagnosis not present

## 2019-11-05 DIAGNOSIS — Z961 Presence of intraocular lens: Secondary | ICD-10-CM | POA: Diagnosis not present

## 2019-11-05 DIAGNOSIS — H35373 Puckering of macula, bilateral: Secondary | ICD-10-CM | POA: Diagnosis not present

## 2019-11-05 DIAGNOSIS — H43813 Vitreous degeneration, bilateral: Secondary | ICD-10-CM | POA: Diagnosis not present

## 2019-12-20 ENCOUNTER — Ambulatory Visit: Payer: Medicare Other | Attending: Internal Medicine

## 2019-12-20 ENCOUNTER — Other Ambulatory Visit: Payer: Self-pay

## 2019-12-20 DIAGNOSIS — Z23 Encounter for immunization: Secondary | ICD-10-CM | POA: Insufficient documentation

## 2019-12-20 NOTE — Progress Notes (Signed)
   Covid-19 Vaccination Clinic  Name:  Cheryl Olson    MRN: [id number] DOB: 07-23-1941  12/20/2019  [name] was observed post Covid-19 immunization for 15 minutes without incidence. She was provided with Vaccine Information Sheet and instruction to access the V-Safe system.   [name] was instructed to call 911 with any severe reactions post vaccine: Marland Kitchen Difficulty breathing  . Swelling of your face and throat  . A fast heartbeat  . A bad rash all over your body  . Dizziness and weakness    Immunizations Administered    Name Date Dose VIS Date Route   Pfizer COVID-19 Vaccine 12/20/2019 12:39 PM 0.3 mL 10/02/2019 Intramuscular   Manufacturer: [employer], [employer]   Lot: [id number]   NDC: [address]-6

## 2020-01-20 ENCOUNTER — Ambulatory Visit: Payer: Medicare Other | Attending: Internal Medicine

## 2020-01-20 DIAGNOSIS — Z23 Encounter for immunization: Secondary | ICD-10-CM

## 2020-01-20 NOTE — Progress Notes (Signed)
   Covid-19 Vaccination Clinic  Name:  Cheryl Olson    MRN: [id number] DOB: 1940/11/08  01/20/2020  [name] was observed post Covid-19 immunization for 15 minutes without incident. She was provided with Vaccine Information Sheet and instruction to access the V-Safe system.   [name] was instructed to call 911 with any severe reactions post vaccine: Marland Kitchen Difficulty breathing  . Swelling of face and throat  . A fast heartbeat  . A bad rash all over body  . Dizziness and weakness   Immunizations Administered    Name Date Dose VIS Date Route   Pfizer COVID-19 Vaccine 01/20/2020  9:24 AM 0.3 mL 10/02/2019 Intramuscular   Manufacturer: [employer], [employer]   Lot: [id number]   NDC: [address]-9

## 2022-08-23 ENCOUNTER — Emergency Department (HOSPITAL_COMMUNITY)
Admission: EM | Admit: 2022-08-23 | Discharge: 2022-08-24 | Disposition: A | Discharge status: Home / Self Care | Payer: Medicare Other | Attending: Emergency Medicine | Admitting: Emergency Medicine

## 2022-08-23 ENCOUNTER — Other Ambulatory Visit: Payer: Self-pay

## 2022-08-23 ENCOUNTER — Emergency Department (HOSPITAL_COMMUNITY): Payer: Medicare Other

## 2022-08-23 ENCOUNTER — Encounter (HOSPITAL_COMMUNITY): Payer: Self-pay

## 2022-08-23 DIAGNOSIS — I10 Essential (primary) hypertension: Secondary | ICD-10-CM | POA: Diagnosis not present

## 2022-08-23 DIAGNOSIS — R55 Syncope and collapse: Secondary | ICD-10-CM | POA: Insufficient documentation

## 2022-08-23 DIAGNOSIS — Z1152 Encounter for screening for COVID-19: Secondary | ICD-10-CM | POA: Insufficient documentation

## 2022-08-23 DIAGNOSIS — R4182 Altered mental status, unspecified: Secondary | ICD-10-CM | POA: Insufficient documentation

## 2022-08-23 HISTORY — DX: Unspecified dementia, unspecified severity, without behavioral disturbance, psychotic disturbance, mood disturbance, and anxiety: F03.90

## 2022-08-23 LAB — BASIC METABOLIC PANEL
Anion gap: 10 (ref 5–15)
BUN: 17 mg/dL (ref 8–23)
CO2: 21 mmol/L — ABNORMAL LOW (ref 22–32)
Calcium: 9 mg/dL (ref 8.9–10.3)
Chloride: 107 mmol/L (ref 98–111)
Creatinine, Ser: 1.08 mg/dL — ABNORMAL HIGH (ref 0.44–1.00)
GFR, Estimated: 52 mL/min — ABNORMAL LOW (ref >60)
Glucose, Bld: 120 mg/dL — ABNORMAL HIGH (ref 70–99)
Potassium: 3.4 mmol/L — ABNORMAL LOW (ref 3.5–5.1)
Sodium: 138 mmol/L (ref 135–145)

## 2022-08-23 LAB — CBC WITH DIFFERENTIAL/PLATELET
Abs Immature Granulocytes: 0.03 10*3/uL (ref 0.00–0.07)
Basophils Absolute: 0.1 10*3/uL (ref 0.0–0.1)
Basophils Relative: 0 %
Eosinophils Absolute: 0.1 10*3/uL (ref 0.0–0.5)
Eosinophils Relative: 1 %
HCT: 37.4 % (ref 36.0–46.0)
Hemoglobin: 12.3 g/dL (ref 12.0–15.0)
Immature Granulocytes: 0 %
Lymphocytes Relative: 9 %
Lymphs Abs: 1.2 10*3/uL (ref 0.7–4.0)
MCH: 31 pg (ref 26.0–34.0)
MCHC: 32.9 g/dL (ref 30.0–36.0)
MCV: 94.2 fL (ref 80.0–100.0)
Monocytes Absolute: 0.6 10*3/uL (ref 0.1–1.0)
Monocytes Relative: 5 %
Neutro Abs: 11.3 10*3/uL — ABNORMAL HIGH (ref 1.7–7.7)
Neutrophils Relative %: 85 %
Platelets: 229 10*3/uL (ref 150–400)
RBC: 3.97 MIL/uL (ref 3.87–5.11)
RDW: 13.6 % (ref 11.5–15.5)
WBC: 13.3 10*3/uL — ABNORMAL HIGH (ref 4.0–10.5)
nRBC: 0 % (ref 0.0–0.2)

## 2022-08-23 LAB — CBG MONITORING, ED: Glucose-Capillary: 128 mg/dL — ABNORMAL HIGH (ref 70–99)

## 2022-08-23 LAB — RESP PANEL BY RT-PCR (FLU A&B, COVID) ARPGX2
Influenza A by PCR: NEGATIVE
Influenza B by PCR: NEGATIVE
SARS Coronavirus 2 by RT PCR: NEGATIVE

## 2022-08-23 LAB — TROPONIN I (HIGH SENSITIVITY)
Troponin I (High Sensitivity): 4 ng/L (ref <18)
Troponin I (High Sensitivity): 5 ng/L (ref <18)

## 2022-08-23 MED ORDER — ONDANSETRON 4 MG PO TBDP: 4.0000 mg | ORAL_TABLET | Freq: Once | ORAL | Status: DC

## 2022-08-23 MED ORDER — SODIUM CHLORIDE 0.9 % IV BOLUS
1000.0000 mL | Freq: Once | INTRAVENOUS | Status: AC
  Administered 2022-08-23: 1000 mL via INTRAVENOUS

## 2022-08-23 MED ORDER — ONDANSETRON HCL 4 MG/2ML IJ SOLN
4.0000 mg | Freq: Once | INTRAMUSCULAR | Status: AC
  Administered 2022-08-23: 4 mg via INTRAVENOUS
  Filled 2022-08-23: qty 2

## 2022-08-23 NOTE — ED Provider Notes (Signed)
[facility]-EMERGENCY DEPT Provider Note   CSN: [id number] Arrival date & time: 08/23/22  2004     History  Chief Complaint  Patient presents with   Loss of Consciousness    Cheryl Olson is a 81 y.o. female.  HPI Patient had a normal day.  Her son was at her home and they had been cooking dinner together.  She had been feeling well with no complaints.  They had done a number of activities in the house.  She reported needing to go the bathroom and after a while had not come back out.  He heard a thump and recognized that she had fallen in the bathroom.  He went to check and she answered to the door that she had fallen but was not injured but still needed to go the bathroom.  She was in there while longer and he went back to check again and she was on the toilet.  She was too weak to get up by herself.  She had massive amount of diarrheal stool.  They were able to get cleaned back up and she was still extremely weak and then had an episode of complete loss of consciousness after which she briefly stiffened her body and then became limp.  She was briefly confused upon awakening but then returned to baseline mental status.  On EMS arrival blood sugar was in the 170s.  Heart rate appeared to be variable from the 40s to 50s.  Blood pressures also documented variable from hypotensive to hypertension.  Patient does not remember most of the episode.  She remembers going to the bathroom and then subsequently waking up later with EMS.  He denies she has any pain either before or after the episode.  She has no headache, no visual changes, no chest pain, no shortness of breath.  She did not have any focal weakness or neurologic deficit before or after the episode.  With some general confusion that cleared as her symptoms improved.  He also reportedly had 1 large episode of vomiting with EMS.  She was given fluids on route.    Home Medications Prior to Admission medications    Medication Sig Start Date End Date Taking? Authorizing Provider  escitalopram (LEXAPRO) 10 MG tablet Take 15 mg by mouth daily.   Yes [provider]      Allergies    Other    Review of Systems   Review of Systems  Physical Exam Updated Vital Signs BP (!) 169/62   Pulse 62   Temp 97.8 F (36.6 C)   Resp 16   Ht 5\' 1"  (1.549 m)   Wt 47.6 kg   SpO2 100%   BMI 19.84 kg/m  Physical Exam Constitutional:      Comments: Alert nontoxic clear mental status.  No respiratory distress.  Well-nourished well-developed.  HENT:     Head: Normocephalic and atraumatic.     Nose: Nose normal.     Mouth/Throat:     Mouth: Mucous membranes are moist.     Pharynx: Oropharynx is clear.  Eyes:     Extraocular Movements: Extraocular movements intact.     Conjunctiva/sclera: Conjunctivae normal.     Pupils: Pupils are equal, round, and reactive to light.  Neck:     Comments: C-spine nontender..  Neck supple.  No lymphadenopathy. Cardiovascular:     Rate and Rhythm: Normal rate and regular rhythm.  Pulmonary:     Effort: Pulmonary effort is normal.  Breath sounds: Normal breath sounds.     Comments: Chest wall nontender to compression. Abdominal:     General: There is no distension.     Palpations: Abdomen is soft.     Tenderness: There is no abdominal tenderness. There is no guarding.  Musculoskeletal:        General: Normal range of motion.     Right lower leg: No edema.     Left lower leg: No edema.     Comments: Patient has normal range of motion bilateral lower extremities.  She is able to deep flex both at the hips knees and ankles without pain.  She pushes against resistance with good strength.  No appearance of contusions abrasions or bruising to the back.  Skin:    General: Skin is warm and dry.  Neurological:     General: No focal deficit present.     Mental Status: She is oriented to person, place, and time.     Motor: No weakness.     Coordination:  Coordination normal.     Comments: Mental status clear.  Speech is clear.  Cognitive function intact.  Motor strength 5\5 upper and lower extremities.  Psychiatric:        Mood and Affect: Mood normal.     ED Results / Procedures / Treatments   Labs (all labs ordered are listed, but only abnormal results are displayed) Labs Reviewed  BASIC METABOLIC PANEL - Abnormal; Notable for the following components:      Result Value   Potassium 3.4 (*)    CO2 21 (*)    Glucose, Bld 120 (*)    Creatinine, Ser 1.08 (*)    GFR, Estimated 52 (*)    All other components within normal limits  CBC WITH DIFFERENTIAL/PLATELET - Abnormal; Notable for the following components:   WBC 13.3 (*)    Neutro Abs 11.3 (*)    All other components within normal limits  URINALYSIS, ROUTINE W REFLEX MICROSCOPIC - Abnormal; Notable for the following components:   Ketones, ur 20 (*)    All other components within normal limits  CBG MONITORING, ED - Abnormal; Notable for the following components:   Glucose-Capillary 128 (*)    All other components within normal limits  RESP PANEL BY RT-PCR (FLU A&B, COVID) [id number]  TROPONIN I (HIGH SENSITIVITY)  TROPONIN I (HIGH SENSITIVITY)    EKG EKG Interpretation  Date/Time:  Thursday August 23 2022 21:01:38 EDT Ventricular Rate:  56 PR Interval:  171 QRS Duration: 91 QT Interval:  519 QTC Calculation: 501 R Axis:   47 Text Interpretation: Sinus rhythm Consider left atrial enlargement Anteroseptal infarct, old no sig change from previous Confirmed by Arby Barrette [phone]) on 08/23/2022 10:46:16 PM  Radiology DG Chest 2 View  Result Date: 08/23/2022 CLINICAL DATA:  Syncopal episode EXAM: CHEST - 2 VIEW COMPARISON:  11/14/2017 FINDINGS: Chronic elevation of the right diaphragm. No acute airspace disease or pleural effusion. Stable cardiomediastinal silhouette with aortic atherosclerosis. No pneumothorax. IMPRESSION: No active cardiopulmonary disease. Chronic  elevation of right diaphragm Electronically Signed   By: Jasmine Pang M.D.   On: 08/23/2022 21:48   CT CERVICAL SPINE WO CONTRAST  Result Date: 08/23/2022 CLINICAL DATA:  Unwitnessed fall, patient with dementia, unknown severity of head and neck trauma. EXAM: CT CERVICAL SPINE WITHOUT CONTRAST TECHNIQUE: Multidetector CT imaging of the cervical spine was performed without intravenous contrast. Multiplanar CT image reconstructions were also generated. RADIATION DOSE REDUCTION: This exam was performed according to  the departmental dose-optimization program which includes automated exposure control, adjustment of the mA and/or kV according to patient size and/or use of iterative reconstruction technique. COMPARISON:  None Available. FINDINGS: Alignment: There is bone-on-bone anterior atlantodental joint space loss with reactive osteophytes. The C1 lateral masses are normally positioned on C2. There is a 2 mm likely degenerative anterolisthesis at C4-5, T1-2 and T2-3. No traumatic listhesis is seen or further listhesis. Skull base and vertebrae: There is osteopenia without evidence of fractures or destructive bone lesions or other primary pathologic process. Soft tissues and spinal canal: No prevertebral fluid or swelling. No visible canal hematoma. Disc levels: The cervical discs are diffusely degenerated. Bidirectional osteophytes are present at all levels except for C2-3. Posterior disc osteophyte complexes variably encroaching on the ventral cord surface, with the greatest spondylotic cord encroachment at C5-6 and C6-7 where the cord is mildly compressed. No herniated disc is seen. Prominent facet joint and uncinate osteophytes are noted from C2-3 down, with ankylosis across the right C2-3 facet joints. Acquired degenerative foraminal stenosis is bilaterally moderate to severe at C3-4, bilaterally moderate at C4-5 and at C5-6, bilaterally mild at C6-7, and severe on the right at C7-T1. Upper chest: Lung apices  show mild paraseptal emphysematous change, slight scarring. No pneumothorax. Other: None. IMPRESSION: 1. Osteopenia and degenerative change without evidence of fractures. 2. Three levels showing a slight listhesis, most likely degenerative in etiology. 3. Emphysema. Emphysema (ICD10-J43.9). Electronically Signed   By: Telford Nab M.D.   On: 08/23/2022 21:12   CT HEAD WO CONTRAST  Result Date: 08/23/2022 CLINICAL DATA:  Syncope, fall EXAM: CT HEAD WITHOUT CONTRAST TECHNIQUE: Contiguous axial images were obtained from the base of the skull through the vertex without intravenous contrast. RADIATION DOSE REDUCTION: This exam was performed according to the departmental dose-optimization program which includes automated exposure control, adjustment of the mA and/or kV according to patient size and/or use of iterative reconstruction technique. COMPARISON:  05/31/2012 FINDINGS: Brain: No acute intracranial findings are seen noncontrast CT brain. There are no signs of bleeding within the cranium. Cortical sulci are prominent. There is decreased density in periventricular and subcortical white matter. Vascular: Scattered arterial calcifications are seen. Skull: Unremarkable. Sinuses/Orbits: Paranasal sinuses are unremarkable. Small metallic densities noted in the medial margins of both optic globes suggesting previous surgical intervention. Other: None. IMPRESSION: No acute intracranial findings are seen in noncontrast CT brain. Atrophy. Small-vessel disease. Electronically Signed   By: Elmer Picker M.D.   On: 08/23/2022 21:07    Procedures Procedures    Medications Ordered in ED Medications  ondansetron (ZOFRAN-ODT) disintegrating tablet 4 mg (4 mg Oral Not Given 08/23/22 2154)  potassium chloride SA (KLOR-CON M) CR tablet 40 mEq (has no administration in time range)  ondansetron (ZOFRAN) injection 4 mg (4 mg Intravenous Given 08/23/22 2154)  sodium chloride 0.9 % bolus 1,000 mL (1,000 mLs Intravenous  New Bag/Given 08/23/22 2351)    ED Course/ Medical Decision Making/ A&P                           Medical Decision Making Risk Prescription drug management.   Patient presents with an episode of syncope and altered mental status at home.  Differential diagnosis includes ACS\dysrhythmia\vasovagal syncope with bowel movement\CVA.  We will proceed with diagnostic evaluation.  CT scan and basic lab work obtained through triage.  Will add orthostatic vital signs and fluid resuscitation.  This time patient has normal neurologic exam without  evidence of acute stroke.  I have monitored the rhythm in the room.  Patient consistently has sinus rhythm in the 60s.  Troponins are flat at 4.  Urinalysis negative for any signs of infection.  COVID and influenza testing negative.  Metabolic panel and CBC are stable.  Patient has remained asymptomatic.  She has been up and ambulatory to the bathroom with coordinated gait.  Heart rate has remained steady in the 60s in sinus rhythm.  At this time suspect vasovagal episode with diarrhea and likely hypotension and bradycardia at the time of episode.  Description of brief tonic activity followed by flaccid LOC sounds consistent with cerebral hypoperfusion.  We have discussed the possibility of vasovagal episode versus self-limited dysrhythmia.  At this time the patient prefers to go home and does not wish to be admitted to the hospital for continuous monitoring.  She and family will contact her provider tomorrow and discuss continuous outpatient cardiac monitoring.  Encouraged to return at any time any concerning or worsening symptoms develop.         Final Clinical Impression(s) / ED Diagnoses Final diagnoses:  Syncope and collapse    Rx / DC Orders ED Discharge Orders     None         Arby Barrette, MD 08/24/22 0041

## 2022-08-23 NOTE — ED Provider Triage Note (Signed)
Emergency Medicine Provider Triage Evaluation Note  Cheryl Olson , a 81 y.o. female  was evaluated in triage.  Pt complains of syncope. Family report pt was in the bathroom earlier today but they heard a thumb, fount pt drowsy and slump over on the commode.  Had some seizure like activity when they guided her to the sofa.  Has some nausea and diarrhea.  Unsure any head injury.  Pt without any complaints.  No recent medication changes.  Review of Systems  Positive: As above Negative: As above  Physical Exam  BP (!) 180/53 (BP Location: Right Arm)   Pulse (!) 59   Temp 97.8 F (36.6 C) (Oral)   Resp 18   Ht 5\' 1"  (1.549 m)   Wt 47.6 kg   SpO2 100%   BMI 19.84 kg/m  Gen:   Awake, no distress   Resp:  Normal effort  MSK:   Moves extremities without difficulty  Other:    Medical Decision Making  Medically screening exam initiated at 8:36 PM.  Appropriate orders placed.  Cheryl Olson was informed that the remainder of the evaluation will be completed by another provider, this initial triage assessment does not replace that evaluation, and the importance of remaining in the ED until their evaluation is complete.     Cheryl Moras, PA-C 08/23/22 2038

## 2022-08-23 NOTE — ED Triage Notes (Addendum)
Pt BIB EMS from home. Pt son was with her today, had a syncopal episode on the toilet. Pt denies hitting her head. Pt son reports pt fell off the toilet, family assisted pt to sofa and called EMS. Pt family not sure of pt hit her head. Pt c/o diarrhea and nausea. Pt family with her. Pt has hx of dementia.   550 mL NS given IV by EMS HR 50's, 60's after fluids 126/62 CBG 173

## 2022-08-24 LAB — URINALYSIS, ROUTINE W REFLEX MICROSCOPIC
Bilirubin Urine: NEGATIVE
Glucose, UA: NEGATIVE mg/dL
Hgb urine dipstick: NEGATIVE
Ketones, ur: 20 mg/dL — AB
Leukocytes,Ua: NEGATIVE
Nitrite: NEGATIVE
Protein, ur: NEGATIVE mg/dL
Specific Gravity, Urine: 1.012 (ref 1.005–1.030)
pH: 7 (ref 5.0–8.0)

## 2022-08-24 MED ORDER — POTASSIUM CHLORIDE CRYS ER 20 MEQ PO TBCR
40.0000 meq | EXTENDED_RELEASE_TABLET | Freq: Once | ORAL | Status: AC
  Administered 2022-08-24: 40 meq via ORAL
  Filled 2022-08-24: qty 2

## 2022-08-24 NOTE — Discharge Instructions (Signed)
1.  Call your doctor in the morning to schedule a follow-up. 2.  You may need to wear a continuous heart monitor.  Discuss this with your doctor who can order a monitor for you. 3.  Return to the emergency department if you have any new worsening or concerning symptoms.

## 2022-09-06 NOTE — Progress Notes (Signed)
Covid and Flu test ordered as part of the screening labs for patient with unexplained syncope, to rule out underlying viral infection causing weakness and syncope.

## 2023-03-28 ENCOUNTER — Encounter: Payer: Self-pay | Admitting: Neurology

## 2023-04-02 ENCOUNTER — Ambulatory Visit: Payer: Medicare Other | Admitting: Neurology

## 2023-04-02 ENCOUNTER — Encounter: Payer: Self-pay | Admitting: Neurology

## 2023-04-02 VITALS — BP 132/68 | HR 61 | Ht 60.0 in | Wt 105.0 lb

## 2023-04-02 DIAGNOSIS — G301 Alzheimer's disease with late onset: Secondary | ICD-10-CM

## 2023-04-02 DIAGNOSIS — F01B0 Vascular dementia, moderate, without behavioral disturbance, psychotic disturbance, mood disturbance, and anxiety: Secondary | ICD-10-CM

## 2023-04-02 DIAGNOSIS — F02B0 Dementia in other diseases classified elsewhere, moderate, without behavioral disturbance, psychotic disturbance, mood disturbance, and anxiety: Secondary | ICD-10-CM

## 2023-04-02 MED ORDER — DONEPEZIL HCL 10 MG PO TABS: 10.0000 mg | ORAL_TABLET | Freq: Every day | ORAL | 11 refills | Status: DC

## 2023-04-02 MED ORDER — MEMANTINE HCL 10 MG PO TABS: 10.0000 mg | ORAL_TABLET | Freq: Two times a day (BID) | ORAL | 11 refills | Status: AC

## 2023-04-02 NOTE — Patient Instructions (Addendum)
Will check dementia labs including ATN, B12 and TSH Start Aricept 10 mg, half tablet nightly for 1 month then increase to full tablet if patient able to tolerate.  Side effect of the medication including diarrhea, dizziness and vivid dreams When patient able to tolerate the Aricept,then increase to 10 mg nightly Start Namenda 10 mg twice daily in 6 weeks or sooner if patient unable to tolerate Aricept Continue your other medications Agree with moving patient to a Assisted living facility.  Follow up in 1 year or sooner if worse   There are well-accepted and sensible ways to reduce risk for Alzheimers disease and other degenerative brain disorders .  Exercise Daily Walk A daily 20 minute walk should be part of your routine. Disease related apathy can be a significant roadblock to exercise and the only way to overcome this is to make it a daily routine and perhaps have a reward at the end (something your loved one loves to eat or drink perhaps) or a personal trainer coming to the home can also be very useful. Most importantly, the patient is much more likely to exercise if the caregiver / spouse does it with him/her. In general a structured, repetitive schedule is best.  General Health: Any diseases which effect your body will effect your brain such as a pneumonia, urinary infection, blood clot, heart attack or stroke. Keep contact with your primary care doctor for regular follow ups.  Sleep. A good nights sleep is healthy for the brain. Seven hours is recommended. If you have insomnia or poor sleep habits we can give you some instructions. If you have sleep apnea wear your mask.  Diet: Eating a heart healthy diet is also a good idea; fish and poultry instead of red meat, nuts (mostly non-peanuts), vegetables, fruits, olive oil or canola oil (instead of butter), minimal salt (use other spices to flavor foods), whole grain rice, bread, cereal and pasta and wine in moderation.Research is now showing  that the MIND diet, which is a combination of The Mediterranean diet and the DASH diet, is beneficial for cognitive processing and longevity. Information about this diet can be found in The MIND Diet, a book by Alonna Minium, MS, RDN, and online at [employer]  Finances, Power of [address] and Advance Directives: You should consider putting legal safeguards in place with regard to financial and medical decision making. While the spouse always has power of attorney for medical and financial issues in the absence of any form, you should consider what you want in case the spouse / caregiver is no longer around or capable of making decisions.
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
## 2023-04-05 LAB — ATN PROFILE
A -- Beta-amyloid 42/40 Ratio: 0.103 (ref >0.102)
Beta-amyloid 40: 246.37 pg/mL
Beta-amyloid 42: 25.47 pg/mL
N -- NfL, Plasma: 17.2 pg/mL — ABNORMAL HIGH (ref 0.00–11.55)
T -- p-tau181: 2.08 pg/mL — ABNORMAL HIGH (ref 0.00–0.97)

## 2023-04-05 LAB — VITAMIN B12: Vitamin B-12: 346 pg/mL (ref 232–1245)

## 2023-04-05 LAB — TSH: TSH: 2.06 u[IU]/mL (ref 0.450–4.500)

## 2023-04-06 MED ORDER — VITAMIN B-12 1000 MCG PO TABS: 1000.0000 ug | ORAL_TABLET | Freq: Every day | ORAL | 2 refills | Status: DC

## 2023-04-06 NOTE — Progress Notes (Signed)
Please also inform patient that I will start her on Vitamin B 12 supplement to take daily

## 2023-04-06 NOTE — Progress Notes (Signed)
Please call and advise the patient/son and daughter that the recent labs we checked did not show evidence of Alzheimer disease biomarker's. 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 Continue with the Aricept. Please remind patient to keep any upcoming appointments or tests and to call us with any interim questions, concerns, problems or updates. Thanks,   Windell Norfolk, MD

## 2023-04-06 NOTE — Addendum Note (Signed)
Addended byWindell Norfolk on: 04/06/2023 11:06 AM   Modules accepted: Orders

## 2023-04-08 NOTE — Progress Notes (Signed)
Continue with Aricept 1/2 tablet every other day.

## 2023-04-15 ENCOUNTER — Telehealth: Payer: Self-pay | Admitting: Neurology

## 2023-04-15 NOTE — Telephone Encounter (Signed)
At this point, they can stop the Aricept and they can start the memantine, I would recommend 10 mg at bedtime for the first 2 weeks and then increase it to twice daily.  The memantine itself may make her drowsy, would not recommend adding anything additional for sleep for now but we can consider melatonin in the near future.

## 2023-04-15 NOTE — Telephone Encounter (Signed)
1st attempt left msg

## 2023-04-15 NOTE — Telephone Encounter (Signed)
Call to daughter stephanie who reports patient began taking aricpet 10 mg after visit on 04/02/23 and had nausea and decreased appetite and more confusion. Was instructed to cut back to 5 mg every other day however patient is still having decreased appetite but nausea has improved. Daughter states Dr. Teresa Coombs also gave a prescription for Namenda 10mg  2x daily which they haven't started yet. Daughter wants advice on stopping the Aricept and staring the namenda. She also states her  moms sleep schedule is off and would like something for sleep. Patient moved to SNF over the weekend and education provided to daughter on increased confusion in regards to new environment. Daughter verbalized understanding.  Advised I would send to covering physician for advice and recommendations.

## 2023-04-15 NOTE — Telephone Encounter (Signed)
Pt's daughter reports that pt is now in assisted living and is constantly trying to leave the facility.  Daughter feels she is having a bad reaction to  donepezil (ARICEPT) 10 MG tablet,please call daughter to discuss.

## 2023-04-16 NOTE — Telephone Encounter (Signed)
2nd attempt left msg

## 2023-04-22 ENCOUNTER — Ambulatory Visit: Payer: Medicare Other | Admitting: Neurology

## 2023-05-23 ENCOUNTER — Other Ambulatory Visit: Payer: Self-pay

## 2023-05-23 ENCOUNTER — Emergency Department (HOSPITAL_COMMUNITY): Payer: Medicare Other

## 2023-05-23 ENCOUNTER — Encounter (HOSPITAL_COMMUNITY): Payer: Self-pay

## 2023-05-23 ENCOUNTER — Emergency Department (HOSPITAL_COMMUNITY)
Admission: EM | Admit: 2023-05-23 | Discharge: 2023-05-23 | Disposition: A | Discharge status: Home / Self Care | Payer: Medicare Other | Attending: Emergency Medicine | Admitting: Emergency Medicine

## 2023-05-23 DIAGNOSIS — R4182 Altered mental status, unspecified: Secondary | ICD-10-CM | POA: Diagnosis present

## 2023-05-23 DIAGNOSIS — F410 Panic disorder [episodic paroxysmal anxiety] without agoraphobia: Secondary | ICD-10-CM | POA: Diagnosis not present

## 2023-05-23 DIAGNOSIS — Z79899 Other long term (current) drug therapy: Secondary | ICD-10-CM | POA: Insufficient documentation

## 2023-05-23 DIAGNOSIS — N289 Disorder of kidney and ureter, unspecified: Secondary | ICD-10-CM | POA: Insufficient documentation

## 2023-05-23 LAB — COMPREHENSIVE METABOLIC PANEL
ALT: 12 U/L (ref 0–44)
AST: 23 U/L (ref 15–41)
Albumin: 4.1 g/dL (ref 3.5–5.0)
Alkaline Phosphatase: 58 U/L (ref 38–126)
Anion gap: 13 (ref 5–15)
BUN: 21 mg/dL (ref 8–23)
CO2: 25 mmol/L (ref 22–32)
Calcium: 9.8 mg/dL (ref 8.9–10.3)
Chloride: 100 mmol/L (ref 98–111)
Creatinine, Ser: 1.32 mg/dL — ABNORMAL HIGH (ref 0.44–1.00)
GFR, Estimated: 41 mL/min — ABNORMAL LOW (ref >60)
Glucose, Bld: 102 mg/dL — ABNORMAL HIGH (ref 70–99)
Potassium: 3.5 mmol/L (ref 3.5–5.1)
Sodium: 138 mmol/L (ref 135–145)
Total Bilirubin: 0.8 mg/dL (ref 0.3–1.2)
Total Protein: 7 g/dL (ref 6.5–8.1)

## 2023-05-23 LAB — CBC WITH DIFFERENTIAL/PLATELET
Abs Immature Granulocytes: 0.02 10*3/uL (ref 0.00–0.07)
Basophils Absolute: 0.1 10*3/uL (ref 0.0–0.1)
Basophils Relative: 1 %
Eosinophils Absolute: 0.3 10*3/uL (ref 0.0–0.5)
Eosinophils Relative: 5 %
HCT: 41.5 % (ref 36.0–46.0)
Hemoglobin: 13.8 g/dL (ref 12.0–15.0)
Immature Granulocytes: 0 %
Lymphocytes Relative: 26 %
Lymphs Abs: 1.5 10*3/uL (ref 0.7–4.0)
MCH: 31.1 pg (ref 26.0–34.0)
MCHC: 33.3 g/dL (ref 30.0–36.0)
MCV: 93.5 fL (ref 80.0–100.0)
Monocytes Absolute: 0.3 10*3/uL (ref 0.1–1.0)
Monocytes Relative: 5 %
Neutro Abs: 3.6 10*3/uL (ref 1.7–7.7)
Neutrophils Relative %: 63 %
Platelets: 234 10*3/uL (ref 150–400)
RBC: 4.44 MIL/uL (ref 3.87–5.11)
RDW: 13.2 % (ref 11.5–15.5)
WBC: 5.7 10*3/uL (ref 4.0–10.5)
nRBC: 0 % (ref 0.0–0.2)

## 2023-05-23 LAB — URINALYSIS, ROUTINE W REFLEX MICROSCOPIC
Bacteria, UA: NONE SEEN
Bilirubin Urine: NEGATIVE
Glucose, UA: NEGATIVE mg/dL
Hgb urine dipstick: NEGATIVE
Ketones, ur: NEGATIVE mg/dL
Nitrite: NEGATIVE
Protein, ur: NEGATIVE mg/dL
Specific Gravity, Urine: 1.006 (ref 1.005–1.030)
pH: 7 (ref 5.0–8.0)

## 2023-05-23 LAB — CBG MONITORING, ED: Glucose-Capillary: 98 mg/dL (ref 70–99)

## 2023-05-23 LAB — TROPONIN I (HIGH SENSITIVITY): Troponin I (High Sensitivity): 5 ng/L (ref <18)

## 2023-05-23 NOTE — ED Triage Notes (Signed)
Pt BIBA from [facility] (SNF) with staff reporting that Pt woke up all of a sudden screaming that she couldn't breath and increased confusion. Pt has dementia @baseline .

## 2023-05-23 NOTE — ED Notes (Signed)
Pt was ambulated in the room and she voiced no complaints. Sats remained in the high 90's and no ShoB was noted.

## 2023-05-23 NOTE — ED Notes (Signed)
Pt family friend, Jari Favre is coming to pick up pt. Per Dr. Madilyn Hook, he is not too far away so she should be DC soon.

## 2023-05-23 NOTE — ED Provider Notes (Signed)
[place] EMERGENCY DEPARTMENT AT [facility] Provider Note   CSN: [id number] Arrival date & time: 05/23/23  0455     History  Chief Complaint  Patient presents with   Altered Mental Status    Cheryl Olson is a 82 y.o. female.  The history is provided by the patient, the EMS personnel and medical records.  Altered Mental Status Cheryl Olson is a 82 y.o. female who presents to the Emergency Department complaining of altered mental status.  She presents to the emergency department by ambulance from [facility] [place] following a possible panic attack.  She has a history of dementia and tonight she walked out of her room and started screaming and stating that she could not breathe.  Patient denies any complaints at time of ED arrival.  Patient is unsure why she is in the emergency department.     Home Medications Prior to Admission medications   Medication Sig Start Date End Date Taking? Authorizing Provider  albuterol (VENTOLIN HFA) 108 (90 Base) MCG/ACT inhaler Inhale 1 puff into the lungs every 4 (four) hours as needed for wheezing or shortness of breath.   Yes [provider]  donepezil (ARICEPT) 5 MG tablet Take 2.5 mg by mouth at bedtime.   Yes [provider]  escitalopram (LEXAPRO) 10 MG tablet Take 15 mg by mouth daily.   Yes [provider]  hydrOXYzine (ATARAX) 50 MG tablet Take 50 mg by mouth 2 (two) times daily as needed for anxiety.   Yes [provider]  memantine (NAMENDA) 10 MG tablet Take 1 tablet (10 mg total) by mouth 2 (two) times daily. 04/02/23 03/27/24 Yes Windell Norfolk, MD  traZODone (DESYREL) 50 MG tablet Take 50 mg by mouth at bedtime.   Yes [provider]  Monte Fantasia INHUB 100-50 MCG/ACT AEPB Inhale 1 puff into the lungs 2 (two) times daily as needed (sob/wheezing). 12/12/21  Yes [provider]  cyanocobalamin (VITAMIN B12) 1000 MCG tablet Take 1 tablet (1,000 mcg total) by mouth  daily. Patient not taking: Reported on 05/23/2023 04/06/23   Windell Norfolk, MD  donepezil (ARICEPT) 10 MG tablet Take 1 tablet (10 mg total) by mouth at bedtime. Patient not taking: Reported on 05/23/2023 04/02/23   Windell Norfolk, MD      Allergies    Walnut and Other    Review of Systems   Review of Systems  Unable to perform ROS: Dementia    Physical Exam Updated Vital Signs BP (!) 144/70   Pulse (!) 57   Temp 97.9 F (36.6 C) (Rectal)   Resp 20   Ht 5' (1.524 m)   SpO2 95%   BMI 20.51 kg/m  Physical Exam Vitals and nursing note reviewed.  Constitutional:      Appearance: She is well-developed.  HENT:     Head: Normocephalic and atraumatic.  Cardiovascular:     Rate and Rhythm: Normal rate and regular rhythm.     Heart sounds: No murmur heard. Pulmonary:     Effort: Pulmonary effort is normal. No respiratory distress.     Breath sounds: Normal breath sounds.  Abdominal:     Palpations: Abdomen is soft.     Tenderness: There is no abdominal tenderness. There is no guarding or rebound.  Musculoskeletal:        General: No tenderness.  Skin:    General: Skin is warm and dry.  Neurological:     Mental Status: She is alert.  Comments: Disoriented to time, place and recent events.  Moves all extremities symmetrically and strongly.  Psychiatric:     Comments: Anxious.     ED Results / Procedures / Treatments   Labs (all labs ordered are listed, but only abnormal results are displayed) Labs Reviewed  COMPREHENSIVE METABOLIC PANEL - Abnormal; Notable for the following components:      Result Value   Glucose, Bld 102 (*)    Creatinine, Ser 1.32 (*)    GFR, Estimated 41 (*)    All other components within normal limits  URINALYSIS, ROUTINE W REFLEX MICROSCOPIC - Abnormal; Notable for the following components:   Color, Urine STRAW (*)    Leukocytes,Ua LARGE (*)    All other components within normal limits  CBC WITH DIFFERENTIAL/PLATELET  CBG MONITORING, ED   TROPONIN I (HIGH SENSITIVITY)    EKG EKG Interpretation Date/Time:  Thursday May 23 2023 05:21:21 EDT Ventricular Rate:  58 PR Interval:  142 QRS Duration:  102 QT Interval:  501 QTC Calculation: 493 R Axis:   -10  Text Interpretation: Sinus rhythm Minimal ST depression, lateral leads Borderline prolonged QT interval No significant change since last tracing Confirmed by Tilden Fossa [phone]) on 05/23/2023 5:26:23 AM  Radiology DG Chest Port 1 View  Result Date: 05/23/2023 CLINICAL DATA:  Shortness of breath.  Shaking. EXAM: PORTABLE CHEST 1 VIEW COMPARISON:  08/23/2022 FINDINGS: Chronic elevation of the right diaphragm with overlying streaky density. No edema, effusion, or pneumothorax. Normal heart size. No acute osseous finding. IMPRESSION: Chronic elevation of the right diaphragm with overlying atelectasis. No acute finding compared to prior. Electronically Signed   By: Tiburcio Pea M.D.   On: 05/23/2023 06:16    Procedures Procedures    Medications Ordered in ED Medications - No data to display  ED Course/ Medical Decision Making/ A&P                                 Medical Decision Making Amount and/or Complexity of Data Reviewed Labs: ordered. Radiology: ordered.   Patient with history of dementia brought into the emergency department after an episode of screaming, stating she could not breathe.  Patient is confused, anxious on ED arrival but does not have complaints of shortness of breath or chest pain.  Labs with mild renal insufficiency-similar when compared to priors.  UA not consistent with UTI.  CBC without anemia.  She does not have any stigmata of DVT, doubt PE.  Discussed with patient's son over the phone.  He states that she does have a history of panic attacks, and her current presentation does sound similar to her prior panic attacks.  Plan to discharge back to facility with PCP follow-up and return precautions.  Doubt pneumonia, PE,  ACS.        Final Clinical Impression(s) / ED Diagnoses Final diagnoses:  Panic attack  Renal impairment    Rx / DC Orders ED Discharge Orders     None         Tilden Fossa, MD 05/23/23 0730

## 2023-08-29 ENCOUNTER — Ambulatory Visit: Payer: Self-pay | Admitting: Adult Health

## 2023-11-13 ENCOUNTER — Emergency Department (HOSPITAL_COMMUNITY)
Admission: EM | Admit: 2023-11-13 | Discharge: 2023-11-13 | Disposition: A | Discharge status: Home / Self Care | Payer: Medicare Other | Attending: Emergency Medicine | Admitting: Emergency Medicine

## 2023-11-13 ENCOUNTER — Emergency Department (HOSPITAL_COMMUNITY): Payer: Medicare Other

## 2023-11-13 DIAGNOSIS — F0280 Dementia in other diseases classified elsewhere without behavioral disturbance: Secondary | ICD-10-CM | POA: Diagnosis not present

## 2023-11-13 DIAGNOSIS — G309 Alzheimer's disease, unspecified: Secondary | ICD-10-CM | POA: Insufficient documentation

## 2023-11-13 DIAGNOSIS — N289 Disorder of kidney and ureter, unspecified: Secondary | ICD-10-CM | POA: Insufficient documentation

## 2023-11-13 DIAGNOSIS — U071 COVID-19: Secondary | ICD-10-CM | POA: Insufficient documentation

## 2023-11-13 DIAGNOSIS — R55 Syncope and collapse: Secondary | ICD-10-CM | POA: Diagnosis present

## 2023-11-13 LAB — BASIC METABOLIC PANEL
Anion gap: 6 (ref 5–15)
BUN: 18 mg/dL (ref 8–23)
CO2: 28 mmol/L (ref 22–32)
Calcium: 8.7 mg/dL — ABNORMAL LOW (ref 8.9–10.3)
Chloride: 103 mmol/L (ref 98–111)
Creatinine, Ser: 1.1 mg/dL — ABNORMAL HIGH (ref 0.44–1.00)
GFR, Estimated: 50 mL/min — ABNORMAL LOW (ref >60)
Glucose, Bld: 109 mg/dL — ABNORMAL HIGH (ref 70–99)
Potassium: 4.2 mmol/L (ref 3.5–5.1)
Sodium: 137 mmol/L (ref 135–145)

## 2023-11-13 LAB — CBC WITH DIFFERENTIAL/PLATELET
Abs Immature Granulocytes: 0.02 10*3/uL (ref 0.00–0.07)
Basophils Absolute: 0 10*3/uL (ref 0.0–0.1)
Basophils Relative: 1 %
Eosinophils Absolute: 0 10*3/uL (ref 0.0–0.5)
Eosinophils Relative: 1 %
HCT: 38.7 % (ref 36.0–46.0)
Hemoglobin: 12.5 g/dL (ref 12.0–15.0)
Immature Granulocytes: 1 %
Lymphocytes Relative: 16 %
Lymphs Abs: 0.6 10*3/uL — ABNORMAL LOW (ref 0.7–4.0)
MCH: 30.6 pg (ref 26.0–34.0)
MCHC: 32.3 g/dL (ref 30.0–36.0)
MCV: 94.6 fL (ref 80.0–100.0)
Monocytes Absolute: 0.5 10*3/uL (ref 0.1–1.0)
Monocytes Relative: 13 %
Neutro Abs: 2.7 10*3/uL (ref 1.7–7.7)
Neutrophils Relative %: 68 %
Platelets: 172 10*3/uL (ref 150–400)
RBC: 4.09 MIL/uL (ref 3.87–5.11)
RDW: 13.8 % (ref 11.5–15.5)
WBC: 4 10*3/uL (ref 4.0–10.5)
nRBC: 0 % (ref 0.0–0.2)

## 2023-11-13 LAB — URINALYSIS, ROUTINE W REFLEX MICROSCOPIC
Bilirubin Urine: NEGATIVE
Glucose, UA: NEGATIVE mg/dL
Hgb urine dipstick: NEGATIVE
Ketones, ur: 5 mg/dL — AB
Leukocytes,Ua: NEGATIVE
Nitrite: NEGATIVE
Protein, ur: NEGATIVE mg/dL
Specific Gravity, Urine: 1.009 (ref 1.005–1.030)
pH: 6 (ref 5.0–8.0)

## 2023-11-13 LAB — MAGNESIUM: Magnesium: 2.2 mg/dL (ref 1.7–2.4)

## 2023-11-13 LAB — RESP PANEL BY RT-PCR (RSV, FLU A&B, COVID)  RVPGX2
Influenza A by PCR: NEGATIVE
Influenza B by PCR: NEGATIVE
Resp Syncytial Virus by PCR: NEGATIVE
SARS Coronavirus 2 by RT PCR: POSITIVE — AB

## 2023-11-13 MED ORDER — MOLNUPIRAVIR EUA 200MG CAPSULE: 4.0000 | ORAL_CAPSULE | Freq: Two times a day (BID) | ORAL | 0 refills | Status: AC

## 2023-11-13 MED ORDER — LACTATED RINGERS IV BOLUS
1000.0000 mL | Freq: Once | INTRAVENOUS | Status: AC
  Administered 2023-11-13: 1000 mL via INTRAVENOUS

## 2023-11-13 NOTE — ED Triage Notes (Addendum)
Pt BIB [facility] from [facility] [place] due to near syncope at her facility. Pt was standing and almost fell but was caught coming down. Pt does have dementia and alzheimer's.  No known blood thinners taken.  20g left arm.  4mg  Zofran.  COVID +. EMS Vitals BP 90/60, HR 50, SpO2 98%

## 2023-11-13 NOTE — ED Notes (Signed)
Pt was taken out by wheelchair with granddaughter.

## 2023-11-13 NOTE — ED Notes (Signed)
NT Deonna at bedside obtaining EKG on patient and placing on monitor.

## 2023-11-13 NOTE — ED Notes (Signed)
CT coming to get patient scanned in a few minutes.

## 2023-11-13 NOTE — ED Notes (Signed)
Patient transported to CT 

## 2023-11-13 NOTE — ED Notes (Signed)
Pt ambulated to restroom with minimal assistance.

## 2023-11-13 NOTE — ED Provider Notes (Signed)
[place] EMERGENCY DEPARTMENT AT [facility] Provider Note   CSN: [id number] Arrival date & time: 11/13/23  1234     History  Chief Complaint  Patient presents with   Near Syncope    Cheryl Olson is a 83 y.o. female.  83 year old female presents today for concern of syncopal episode.  Most of the history is provided by son.  [facility]  Apparently there has been an outbreak of COVID in the retirement community.  She denies any complaints at this time.  Level 5 caveat dementia.  The history is provided by the patient. No language interpreter was used.       Home Medications Prior to Admission medications   Medication Sig Start Date End Date Taking? Authorizing Provider  albuterol (VENTOLIN HFA) 108 (90 Base) MCG/ACT inhaler Inhale 1 puff into the lungs every 4 (four) hours as needed for wheezing or shortness of breath.   Yes [provider]  divalproex (DEPAKOTE) 125 MG DR tablet Take 125 mg by mouth every evening.   Yes [provider]  donepezil (ARICEPT) 5 MG tablet Take 2.5 mg by mouth See admin instructions. Take 2.5 mg every other day at bedtime.   Yes [provider]  escitalopram (LEXAPRO) 10 MG tablet Take 15 mg by mouth daily.   Yes [provider]  hydrOXYzine (ATARAX) 50 MG tablet Take 50 mg by mouth 2 (two) times daily as needed for anxiety.   Yes [provider]  memantine (NAMENDA) 10 MG tablet Take 1 tablet (10 mg total) by mouth 2 (two) times daily. 04/02/23 03/27/24 Yes Windell Norfolk, MD  traZODone (DESYREL) 50 MG tablet Take 50 mg by mouth at bedtime as needed for sleep (mood/anxiety disorder).   Yes [provider]  Monte Fantasia INHUB 100-50 MCG/ACT AEPB Inhale 1 puff into the lungs 2 (two) times daily as needed (sob/wheezing). 12/12/21  Yes [provider]      Allergies    Walnut and Other    Review of Systems   Review of Systems  Constitutional:  Negative  for chills and fever.  Respiratory:  Negative for shortness of breath.   Cardiovascular:  Negative for chest pain.  Neurological:  Positive for syncope.  All other systems reviewed and are negative.   Physical Exam Updated Vital Signs BP (!) 165/56 (BP Location: Right Arm)   Pulse (!) 51   Temp (!) 97.4 F (36.3 C) (Oral)   Resp 20   Ht 5\' 5"  (1.651 m)   Wt 45.4 kg   SpO2 100%   BMI 16.64 kg/m  Physical Exam Vitals and nursing note reviewed.  Constitutional:      General: She is not in acute distress.    Appearance: Normal appearance. She is not ill-appearing.  HENT:     Head: Normocephalic and atraumatic.     Nose: Nose normal.  Eyes:     General: No scleral icterus.    Extraocular Movements: Extraocular movements intact.     Conjunctiva/sclera: Conjunctivae normal.  Cardiovascular:     Rate and Rhythm: Normal rate and regular rhythm.  Pulmonary:     Effort: Pulmonary effort is normal. No respiratory distress.     Breath sounds: Normal breath sounds. No wheezing or rales.  Abdominal:     General: There is no distension.     Tenderness: There is no abdominal tenderness.  Musculoskeletal:        General: Normal range of motion.  Cervical back: Normal range of motion.  Skin:    General: Skin is warm and dry.  Neurological:     General: No focal deficit present.     Mental Status: She is alert. Mental status is at baseline.     ED Results / Procedures / Treatments   Labs (all labs ordered are listed, but only abnormal results are displayed) Labs Reviewed  CBC WITH DIFFERENTIAL/PLATELET - Abnormal; Notable for the following components:      Result Value   Lymphs Abs 0.6 (*)    All other components within normal limits  BASIC METABOLIC PANEL - Abnormal; Notable for the following components:   Glucose, Bld 109 (*)    Creatinine, Ser 1.10 (*)    Calcium 8.7 (*)    GFR, Estimated 50 (*)    All other components within normal limits  RESP PANEL BY RT-PCR (RSV,  FLU A&B, COVID)  RVPGX2  MAGNESIUM  URINALYSIS, ROUTINE W REFLEX MICROSCOPIC    EKG EKG Interpretation Date/Time:  Wednesday November 13 2023 14:36:44 EST Ventricular Rate:  97 PR Interval:    QRS Duration:  82 QT Interval:  516 QTC Calculation: 655 R Axis:   70  Text Interpretation: Sinus rhythm Non-specific ST-t changes Artifact Confirmed by Cathren Laine ([id number]) on 11/13/2023 2:56:35 PM  Radiology No results found.  Procedures Procedures    Medications Ordered in ED Medications  lactated ringers bolus 1,000 mL (1,000 mLs Intravenous New Bag/Given 11/13/23 1345)    ED Course/ Medical Decision Making/ A&P                                 Medical Decision Making Amount and/or Complexity of Data Reviewed Labs: ordered. Radiology: ordered.   83 year old female presents today for concern of syncopal episode.  There is concern for head injury.  Will order CT imaging.  CBC unremarkable, magnesium 2.2.  BMP without acute concern.  Mild renal insufficiency noted.  Respiratory panel ordered.  Currently pending.  According the son there is a COVID outbreak at the retirement facility.  Will provide fluid bolus.  Spoke to son who states that she typically has these episodes but improves with fluids and electrolyte repletion.  He believes she may be dehydrated.   Patient will require reevaluation.  Given she is 83 years old with a syncopal episode and she has had similar episodes in the past and is reasonable to offer her admission.  Signed out to oncoming provider.     Final Clinical Impression(s) / ED Diagnoses Final diagnoses:  Syncope and collapse    Rx / DC Orders ED Discharge Orders     None         Marita Kansas, PA-C 11/13/23 1522    Cathren Laine, MD 11/15/23 [phone]

## 2023-11-13 NOTE — Discharge Instructions (Signed)
You were seen for COVID infection and a syncopal episode.  Please pick up your antiviral and take as prescribed.  Please follow-up with your primary care physician if your symptoms persist for further evaluation and treatment.  Thank you for letting us treat you today. After reviewing your labs and imaging, I feel you are safe to go home. Please follow up with your PCP in the next several days and provide them with your records from this visit. Return to the Emergency Room if pain becomes severe or symptoms worsen.

## 2023-11-13 NOTE — ED Triage Notes (Signed)
NT Deonna to get EKG and place on cardiac monitor.

## 2023-11-13 NOTE — ED Provider Notes (Signed)
Patient was signed out to myself at shift change by Marita Kansas, PA-C pending respiratory panel and imaging.  Please see his note for full HPI, ROS, PE and MDM.  Patient with a past medical history significant for dementia presents today for a syncopal episode of which she has had 2 similar episodes in the past.  In the past she has had an IV fluids and potassium if her levels were low and discharge to her facility.  Patient is not on any known blood thinners.  Patient is COVID-positive.   Physical Exam  BP (!) 165/56 (BP Location: Right Arm)   Pulse (!) 51   Temp (!) 97.4 F (36.3 C) (Oral)   Resp 20   Ht 5\' 5"  (1.651 m)   Wt 45.4 kg   SpO2 100%   BMI 16.64 kg/m   Physical Exam  Procedures  Procedures  ED Course / MDM    Medical Decision Making Amount and/or Complexity of Data Reviewed Labs: ordered. Radiology: ordered.    Labs:  Resp panel: COVID-positive UA: 5 ketones  Imaging: CT Head/Cspine noncon: No evidence of acute intracranial abnormality or C-spine fracture  After talking to Clydene Pugh, the patient's son and medical power of attorney, the patient will be discharged back to her care facility.  I discussed with the patient's son the possibility for admission for observation as the patient could be having an intermittent heart arrhythmia that we have not caught while in the ED.  The patient's son expressed understanding and still desired to have his mother discharge.  Given that the patient is COVID-positive the patient's son did ask for antiviral treatment, after consulting with the ED pharmacist the patient will be given 5 days of Lagevrio given that Paxlovid would interact with her Depakote medication.  All of the patient's sons questions were answered to his satisfaction.  Patient should return to the ED if symptoms worsen for further evaluation and treatment.      Dolphus Jenny, PA-C 11/13/23 1954    Loetta Rough, MD 11/21/23 [phone]

## 2023-11-13 NOTE — ED Notes (Signed)
PA Karie Mainland notified of patient bumping head on the way down to floor.  No abrasion or lacerations noted.

## 2024-04-07 ENCOUNTER — Encounter: Payer: Self-pay | Admitting: Neurology

## 2024-04-07 ENCOUNTER — Ambulatory Visit: Payer: Medicare Other | Admitting: Neurology

## 2024-04-07 VITALS — BP 138/68 | HR 67 | Ht 61.0 in

## 2024-04-07 DIAGNOSIS — F01B0 Vascular dementia, moderate, without behavioral disturbance, psychotic disturbance, mood disturbance, and anxiety: Secondary | ICD-10-CM | POA: Diagnosis not present

## 2024-04-07 MED ORDER — WOMENS 50+ MULTI VITAMIN/MIN PO TABS: 1.0000 | ORAL_TABLET | Freq: Every day | ORAL | 3 refills | Status: AC

## 2024-04-07 NOTE — Patient Instructions (Signed)
 Continue current medications  Continue to follow up with PCP  Return if worse
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
## 2024-08-28 ENCOUNTER — Emergency Department (HOSPITAL_COMMUNITY)

## 2024-08-28 ENCOUNTER — Inpatient Hospital Stay (HOSPITAL_COMMUNITY)

## 2024-08-28 ENCOUNTER — Other Ambulatory Visit: Payer: Self-pay

## 2024-08-28 ENCOUNTER — Inpatient Hospital Stay (HOSPITAL_COMMUNITY)
Admission: EM | Admit: 2024-08-28 | Discharge: 2024-09-02 | DRG: 480 | Disposition: A | Discharge status: Skilled Nursing Facility | Source: Skilled Nursing Facility | Attending: Internal Medicine | Admitting: Internal Medicine

## 2024-08-28 ENCOUNTER — Encounter (HOSPITAL_COMMUNITY)
Admission: EM | Disposition: A | Discharge status: Skilled Nursing Facility | Payer: Self-pay | Source: Skilled Nursing Facility | Attending: Internal Medicine

## 2024-08-28 DIAGNOSIS — S7291XA Unspecified fracture of right femur, initial encounter for closed fracture: Secondary | ICD-10-CM | POA: Diagnosis not present

## 2024-08-28 DIAGNOSIS — I129 Hypertensive chronic kidney disease with stage 1 through stage 4 chronic kidney disease, or unspecified chronic kidney disease: Secondary | ICD-10-CM | POA: Diagnosis present

## 2024-08-28 DIAGNOSIS — Z79899 Other long term (current) drug therapy: Secondary | ICD-10-CM | POA: Diagnosis not present

## 2024-08-28 DIAGNOSIS — F0390 Unspecified dementia without behavioral disturbance: Principal | ICD-10-CM

## 2024-08-28 DIAGNOSIS — Z7982 Long term (current) use of aspirin: Secondary | ICD-10-CM | POA: Diagnosis not present

## 2024-08-28 DIAGNOSIS — Z6824 Body mass index (BMI) 24.0-24.9, adult: Secondary | ICD-10-CM | POA: Diagnosis not present

## 2024-08-28 DIAGNOSIS — E876 Hypokalemia: Secondary | ICD-10-CM | POA: Diagnosis present

## 2024-08-28 DIAGNOSIS — E43 Unspecified severe protein-calorie malnutrition: Secondary | ICD-10-CM | POA: Diagnosis present

## 2024-08-28 DIAGNOSIS — F03918 Unspecified dementia, unspecified severity, with other behavioral disturbance: Secondary | ICD-10-CM | POA: Diagnosis not present

## 2024-08-28 DIAGNOSIS — S72351A Displaced comminuted fracture of shaft of right femur, initial encounter for closed fracture: Secondary | ICD-10-CM | POA: Diagnosis present

## 2024-08-28 DIAGNOSIS — W19XXXA Unspecified fall, initial encounter: Secondary | ICD-10-CM | POA: Diagnosis present

## 2024-08-28 DIAGNOSIS — Y92099 Unspecified place in other non-institutional residence as the place of occurrence of the external cause: Secondary | ICD-10-CM

## 2024-08-28 DIAGNOSIS — J4489 Other specified chronic obstructive pulmonary disease: Secondary | ICD-10-CM | POA: Diagnosis present

## 2024-08-28 DIAGNOSIS — R0902 Hypoxemia: Secondary | ICD-10-CM | POA: Diagnosis present

## 2024-08-28 DIAGNOSIS — R17 Unspecified jaundice: Secondary | ICD-10-CM | POA: Diagnosis present

## 2024-08-28 DIAGNOSIS — F919 Conduct disorder, unspecified: Secondary | ICD-10-CM | POA: Diagnosis not present

## 2024-08-28 DIAGNOSIS — Z87891 Personal history of nicotine dependence: Secondary | ICD-10-CM | POA: Diagnosis not present

## 2024-08-28 DIAGNOSIS — D6959 Other secondary thrombocytopenia: Secondary | ICD-10-CM | POA: Diagnosis not present

## 2024-08-28 DIAGNOSIS — F0394 Unspecified dementia, unspecified severity, with anxiety: Secondary | ICD-10-CM | POA: Diagnosis present

## 2024-08-28 DIAGNOSIS — R7989 Other specified abnormal findings of blood chemistry: Secondary | ICD-10-CM | POA: Diagnosis present

## 2024-08-28 DIAGNOSIS — Z811 Family history of alcohol abuse and dependence: Secondary | ICD-10-CM | POA: Diagnosis not present

## 2024-08-28 DIAGNOSIS — R55 Syncope and collapse: Secondary | ICD-10-CM

## 2024-08-28 DIAGNOSIS — N1831 Chronic kidney disease, stage 3a: Secondary | ICD-10-CM | POA: Diagnosis present

## 2024-08-28 DIAGNOSIS — Z781 Physical restraint status: Secondary | ICD-10-CM | POA: Diagnosis not present

## 2024-08-28 DIAGNOSIS — D62 Acute posthemorrhagic anemia: Secondary | ICD-10-CM | POA: Diagnosis not present

## 2024-08-28 DIAGNOSIS — Z91018 Allergy to other foods: Secondary | ICD-10-CM | POA: Diagnosis not present

## 2024-08-28 DIAGNOSIS — I10 Essential (primary) hypertension: Secondary | ICD-10-CM | POA: Diagnosis not present

## 2024-08-28 DIAGNOSIS — E8809 Other disorders of plasma-protein metabolism, not elsewhere classified: Secondary | ICD-10-CM | POA: Diagnosis present

## 2024-08-28 DIAGNOSIS — J449 Chronic obstructive pulmonary disease, unspecified: Secondary | ICD-10-CM | POA: Diagnosis not present

## 2024-08-28 LAB — COMPREHENSIVE METABOLIC PANEL WITH GFR
ALT: 15 U/L (ref 0–44)
AST: 37 U/L (ref 15–41)
Albumin: 3.7 g/dL (ref 3.5–5.0)
Alkaline Phosphatase: 59 U/L (ref 38–126)
Anion gap: 12 (ref 5–15)
BUN: 13 mg/dL (ref 8–23)
CO2: 25 mmol/L (ref 22–32)
Calcium: 8.9 mg/dL (ref 8.9–10.3)
Chloride: 101 mmol/L (ref 98–111)
Creatinine, Ser: 1.09 mg/dL — ABNORMAL HIGH (ref 0.44–1.00)
GFR, Estimated: 51 mL/min — ABNORMAL LOW (ref >60)
Glucose, Bld: 101 mg/dL — ABNORMAL HIGH (ref 70–99)
Potassium: 3.6 mmol/L (ref 3.5–5.1)
Sodium: 138 mmol/L (ref 135–145)
Total Bilirubin: 0.9 mg/dL (ref 0.0–1.2)
Total Protein: 6.2 g/dL — ABNORMAL LOW (ref 6.5–8.1)

## 2024-08-28 LAB — CBC WITH DIFFERENTIAL/PLATELET
Abs Immature Granulocytes: 0.02 K/uL (ref 0.00–0.07)
Basophils Absolute: 0.1 K/uL (ref 0.0–0.1)
Basophils Relative: 1 %
Eosinophils Absolute: 0.1 K/uL (ref 0.0–0.5)
Eosinophils Relative: 2 %
HCT: 35.8 % — ABNORMAL LOW (ref 36.0–46.0)
Hemoglobin: 11.7 g/dL — ABNORMAL LOW (ref 12.0–15.0)
Immature Granulocytes: 0 %
Lymphocytes Relative: 9 %
Lymphs Abs: 0.8 K/uL (ref 0.7–4.0)
MCH: 31.9 pg (ref 26.0–34.0)
MCHC: 32.7 g/dL (ref 30.0–36.0)
MCV: 97.5 fL (ref 80.0–100.0)
Monocytes Absolute: 0.6 K/uL (ref 0.1–1.0)
Monocytes Relative: 7 %
Neutro Abs: 6.6 K/uL (ref 1.7–7.7)
Neutrophils Relative %: 81 %
Platelets: 182 K/uL (ref 150–400)
RBC: 3.67 MIL/uL — ABNORMAL LOW (ref 3.87–5.11)
RDW: 13.3 % (ref 11.5–15.5)
WBC: 8.1 K/uL (ref 4.0–10.5)
nRBC: 0 % (ref 0.0–0.2)

## 2024-08-28 LAB — CBC
HCT: 31.8 % — ABNORMAL LOW (ref 36.0–46.0)
Hemoglobin: 10.2 g/dL — ABNORMAL LOW (ref 12.0–15.0)
MCH: 31.5 pg (ref 26.0–34.0)
MCHC: 32.1 g/dL (ref 30.0–36.0)
MCV: 98.1 fL (ref 80.0–100.0)
Platelets: 162 K/uL (ref 150–400)
RBC: 3.24 MIL/uL — ABNORMAL LOW (ref 3.87–5.11)
RDW: 13.2 % (ref 11.5–15.5)
WBC: 6.6 K/uL (ref 4.0–10.5)
nRBC: 0 % (ref 0.0–0.2)

## 2024-08-28 LAB — I-STAT CHEM 8, ED
BUN: 17 mg/dL (ref 8–23)
Calcium, Ion: 1.14 mmol/L — ABNORMAL LOW (ref 1.15–1.40)
Chloride: 98 mmol/L (ref 98–111)
Creatinine, Ser: 1 mg/dL (ref 0.44–1.00)
Glucose, Bld: 99 mg/dL (ref 70–99)
HCT: 37 % (ref 36.0–46.0)
Hemoglobin: 12.6 g/dL (ref 12.0–15.0)
Potassium: 3.5 mmol/L (ref 3.5–5.1)
Sodium: 138 mmol/L (ref 135–145)
TCO2: 26 mmol/L (ref 22–32)

## 2024-08-28 LAB — ECHOCARDIOGRAM COMPLETE BUBBLE STUDY
Area-P 1/2: 3.37 cm2
Est EF: >75
S' Lateral: 2.1 cm

## 2024-08-28 LAB — ETHANOL: Alcohol, Ethyl (B): <15 mg/dL (ref <15)

## 2024-08-28 LAB — CREATININE, SERUM
Creatinine, Ser: 0.98 mg/dL (ref 0.44–1.00)
GFR, Estimated: 58 mL/min — ABNORMAL LOW (ref >60)

## 2024-08-28 SURGERY — INSERTION, INTRAMEDULLARY ROD, FEMUR, RETROGRADE
Anesthesia: General | Laterality: Right

## 2024-08-28 MED ORDER — TRAZODONE HCL 50 MG PO TABS
50.0000 mg | ORAL_TABLET | Freq: Every evening | ORAL | Status: DC | PRN
  Administered 2024-08-29 – 2024-09-01 (×5): 50 mg via ORAL
  Filled 2024-08-28 (×5): qty 1

## 2024-08-28 MED ORDER — CHLORHEXIDINE GLUCONATE CLOTH 2 % EX PADS
6.0000 | MEDICATED_PAD | Freq: Every day | CUTANEOUS | Status: DC
  Administered 2024-08-30 – 2024-09-02 (×4): 6 via TOPICAL

## 2024-08-28 MED ORDER — SODIUM CHLORIDE 0.9 % IV SOLN: INTRAVENOUS | Status: AC

## 2024-08-28 MED ORDER — DIVALPROEX SODIUM 125 MG PO DR TAB
125.0000 mg | DELAYED_RELEASE_TABLET | Freq: Every evening | ORAL | Status: DC
  Administered 2024-08-28 – 2024-09-01 (×5): 125 mg via ORAL
  Filled 2024-08-28 (×7): qty 1

## 2024-08-28 MED ORDER — MORPHINE SULFATE (PF) 2 MG/ML IV SOLN
1.0000 mg | INTRAVENOUS | Status: DC | PRN
  Administered 2024-08-28 – 2024-08-31 (×9): 1 mg via INTRAVENOUS
  Filled 2024-08-28 (×9): qty 1

## 2024-08-28 MED ORDER — ESCITALOPRAM OXALATE 10 MG PO TABS
15.0000 mg | ORAL_TABLET | Freq: Every day | ORAL | Status: DC
  Administered 2024-08-30 – 2024-09-02 (×4): 15 mg via ORAL
  Filled 2024-08-28 (×5): qty 2

## 2024-08-28 MED ORDER — MEMANTINE HCL 10 MG PO TABS
10.0000 mg | ORAL_TABLET | Freq: Two times a day (BID) | ORAL | Status: DC
Start: 2024-08-28 — End: 2024-09-02
  Administered 2024-08-28 – 2024-09-02 (×9): 10 mg via ORAL
  Filled 2024-08-28 (×10): qty 1

## 2024-08-28 MED ORDER — IPRATROPIUM-ALBUTEROL 0.5-2.5 (3) MG/3ML IN SOLN: 3.0000 mL | Freq: Four times a day (QID) | RESPIRATORY_TRACT | Status: DC | PRN

## 2024-08-28 MED ORDER — DONEPEZIL HCL 5 MG PO TABS
2.5000 mg | ORAL_TABLET | ORAL | Status: DC
  Administered 2024-08-28: 2.5 mg via ORAL
  Filled 2024-08-28 (×2): qty 1
  Filled 2024-08-28: qty 0.5
  Filled 2024-08-28: qty 1

## 2024-08-28 MED ORDER — HYDRALAZINE HCL 25 MG PO TABS
25.0000 mg | ORAL_TABLET | Freq: Three times a day (TID) | ORAL | Status: DC | PRN
Start: 2024-08-28 — End: 2024-09-02

## 2024-08-28 MED ORDER — ENOXAPARIN SODIUM 40 MG/0.4ML IJ SOSY
40.0000 mg | PREFILLED_SYRINGE | INTRAMUSCULAR | Status: DC
  Administered 2024-08-28 – 2024-08-29 (×2): 40 mg via SUBCUTANEOUS
  Filled 2024-08-28 (×2): qty 0.4

## 2024-08-28 MED ORDER — LORAZEPAM 2 MG/ML IJ SOLN
1.0000 mg | Freq: Once | INTRAMUSCULAR | Status: AC
  Administered 2024-08-28: 1 mg via INTRAVENOUS
  Filled 2024-08-28: qty 1

## 2024-08-28 NOTE — Progress Notes (Signed)
 Orthopedic Tech Progress Note Patient Details:  Cheryl Olson 03-28-1941 [id number]  Musculoskeletal Traction Type of Traction: Bucks Skin Traction Traction Weight: 10 lbs      Samarion Ehle A Wil Slape 08/28/2024, 12:56 PM

## 2024-08-28 NOTE — ED Triage Notes (Signed)
 PT BIB [facility] from [facility] for witnessed mechanical fall, states PT did not hit head or LOC, states she hyperextended knee when falling.. Currently in traction splint to R leg. Received 100 of fentanyl en route with EMS. PT at baseline orientation, Aox1, in memory care unit.    EMS VS: 138/82 BP, 60 HR, 20RR , 98% Spo2 RA,

## 2024-08-28 NOTE — ED Provider Notes (Addendum)
 [place] EMERGENCY DEPARTMENT AT [facility] Provider Note   CSN: [id number] Arrival date & time: 08/28/24  9241     Patient presents with: Cheryl Olson is a 83 y.o. female.   Patient brought in from [facility].  Patient apparently had a fall sometime overnight.  According to folks with her said that she was found on the floor but EMS reported a witnessed mechanical fall.  Not clear what she hit her head or not even though they said they did not but she does seem to be somewhat agitated.  They placed a traction splint on her right leg they said there was a deformity to the right leg.  Patient received 100 mcg of fentanyl and route.  Patient is from the memory care unit.  Past medical history significant for COPD dementia history of asthma.  Patient never used tobacco products.  Patient did have a cervical collar on.  She removed it.  They have her hands in mitts.       Prior to Admission medications   Medication Sig Start Date End Date Taking? Authorizing Provider  albuterol  (VENTOLIN  HFA) 108 (90 Base) MCG/ACT inhaler Inhale 1 puff into the lungs every 4 (four) hours as needed for wheezing or shortness of breath.    [provider]  divalproex (DEPAKOTE) 125 MG DR tablet Take 125 mg by mouth every evening.    [provider]  donepezil  (ARICEPT ) 5 MG tablet Take 2.5 mg by mouth See admin instructions. Take 2.5 mg every other day at bedtime.    [provider]  escitalopram (LEXAPRO) 10 MG tablet Take 15 mg by mouth daily.    [provider]  hydrOXYzine (ATARAX) 50 MG tablet Take 50 mg by mouth 2 (two) times daily as needed for anxiety.    [provider]  memantine  (NAMENDA ) 10 MG tablet Take 1 tablet (10 mg total) by mouth 2 (two) times daily. 04/02/23 04/07/25  Camara, Amadou, MD  traZODone (DESYREL) 50 MG tablet Take 50 mg by mouth at bedtime as needed for sleep (mood/anxiety disorder).    [provider]     Allergies: Walnut and Other    Review of Systems  Unable to perform ROS: Dementia    Updated Vital Signs BP (!) 150/70   Pulse 60   Temp (!) 96.8 F (36 C) (Oral)   Resp 15   Ht 1.549 m (5' 1)   Wt 59 kg   SpO2 100%   BMI 24.56 kg/m   Physical Exam Vitals and nursing note reviewed.  Constitutional:      General: She is not in acute distress.    Appearance: Normal appearance. She is well-developed.  HENT:     Head: Normocephalic and atraumatic.     Comments: No evidence of any trauma to the head. Eyes:     Extraocular Movements: Extraocular movements intact.     Conjunctiva/sclera: Conjunctivae normal.     Pupils: Pupils are equal, round, and reactive to light.  Cardiovascular:     Rate and Rhythm: Normal rate and regular rhythm.     Heart sounds: No murmur heard. Pulmonary:     Effort: Pulmonary effort is normal. No respiratory distress.     Breath sounds: Normal breath sounds.  Abdominal:     Palpations: Abdomen is soft.     Tenderness: There is no abdominal tenderness.  Musculoskeletal:        General: No swelling.  Cervical back: Neck supple.     Comments: Patient with traction on the right leg.  Patient moves the right leg somewhat spontaneously.  Will move her toes.  Cap refill intact.  Same for left leg.  No obvious upper extremity injury.  Skin:    General: Skin is warm and dry.     Capillary Refill: Capillary refill takes less than 2 seconds.  Neurological:     General: No focal deficit present.     Mental Status: She is alert and oriented to person, place, and time.  Psychiatric:        Mood and Affect: Mood normal.     (all labs ordered are listed, but only abnormal results are displayed) Labs Reviewed  COMPREHENSIVE METABOLIC PANEL WITH GFR - Abnormal; Notable for the following components:      Result Value   Glucose, Bld 101 (*)    Creatinine, Ser 1.09 (*)    Total Protein 6.2 (*)    GFR, Estimated 51 (*)    All other components  within normal limits  CBC WITH DIFFERENTIAL/PLATELET - Abnormal; Notable for the following components:   RBC 3.67 (*)    Hemoglobin 11.7 (*)    HCT 35.8 (*)    All other components within normal limits  I-STAT CHEM 8, ED - Abnormal; Notable for the following components:   Calcium, Ion 1.14 (*)    All other components within normal limits  URINALYSIS, ROUTINE W REFLEX MICROSCOPIC    EKG: EKG Interpretation Date/Time:  Friday August 28 2024 08:12:49 EST Ventricular Rate:  59 PR Interval:    QRS Duration:  97 QT Interval:  484 QTC Calculation: 480 R Axis:   41  Text Interpretation: Atrial fibrillation Anteroseptal infarct, old Borderline ST depression, diffuse leads May represent artifact and not be AFIB Confirmed by Azzan Butler [id number]) on 08/28/2024 9:01:50 AM  Radiology: No results found.   Procedures   Medications Ordered in the ED  LORazepam  (ATIVAN ) injection 1 mg (1 mg Intravenous Given 08/28/24 0912)                                    Medical Decision Making Amount and/or Complexity of Data Reviewed Labs: ordered. Radiology: ordered.  Risk Prescription drug management. Decision regarding hospitalization.   Patient not reliable from a history standpoint.  So we will get CT head and neck.  Portable chest x-ray with anticipation that she does have a right hip fracture.  Will get x-ray of the right hip.  Will get labs IV.  Patient's already received fentanyl.  Will give a little bit of Ativan  to help with her agitation.  Chest x-ray without any significant findings based on my read.  Femur x-ray definitely shows distal comminuted femur fracture with some displacement.  Patient does not have an orthopedic doctor.  [facility] is on-call.  Consult placed.  I-STAT without any acute abnormalities.  Complete metabolic panel does give her a GFR of 51 creatinine 1.09 BUN 13.  I-STAT had a creatinine of just 1.  Anion gap normal.  CBC white count 8.1 hemoglobin 11.7  platelets 182.  Formal reads of CT head neck femur pelvis and chest are pending.  Discussed with Ortho.  They will see patient.  If contacted hospitalist for admission she is followed by [facility].  CT head no acute intercranial abnormality mild to moderate chronic small vessel ischemic disease.  No acute cervical spine fracture or traumatic malalignment.  X-ray of femur formally read as acute comminuted spiral fracture of distal right femur.  Chest x-ray mild basilar atelectasis or scarring nothing acute.  X-ray of the pelvis no acute osseous normalities pelvis degenerative changes lower spine.  Mild degenerative changes of the right hip.  Patient will need medical admission and Ortho will need to fix the femur fracture.  Final diagnoses:  Dementia, unspecified dementia severity, unspecified dementia type, unspecified whether behavioral, psychotic, or mood disturbance or anxiety (HCC)  Fall, initial encounter  Closed displaced comminuted fracture of shaft of right femur, initial encounter [facility])    ED Discharge Orders     None          Geraldene Hamilton, MD 08/28/24 9144    Geraldene Hamilton, MD 08/28/24 1006    Geraldene Hamilton, MD 08/28/24 1028
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
## 2024-08-28 NOTE — Progress Notes (Signed)
 Arrived for EEG and Pt. Family refused Exam at this point. Pt has had CT, Echo, and is currently headed to floor while speaking with Ortho for surgery of fractured leg

## 2024-08-28 NOTE — Progress Notes (Signed)
  Echocardiogram 2D Echocardiogram has been performed.  Koleen KANDICE Popper, RDCS 08/28/2024, 2:28 PM
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
## 2024-08-29 ENCOUNTER — Inpatient Hospital Stay (HOSPITAL_COMMUNITY)

## 2024-08-29 ENCOUNTER — Inpatient Hospital Stay (HOSPITAL_COMMUNITY): Admitting: Anesthesiology

## 2024-08-29 ENCOUNTER — Encounter (HOSPITAL_COMMUNITY)
Admission: EM | Disposition: A | Discharge status: Skilled Nursing Facility | Payer: Self-pay | Source: Skilled Nursing Facility | Attending: Internal Medicine

## 2024-08-29 ENCOUNTER — Encounter (HOSPITAL_COMMUNITY): Payer: Self-pay | Admitting: Hospitalist

## 2024-08-29 ENCOUNTER — Other Ambulatory Visit: Payer: Self-pay

## 2024-08-29 DIAGNOSIS — Z87891 Personal history of nicotine dependence: Secondary | ICD-10-CM | POA: Diagnosis not present

## 2024-08-29 DIAGNOSIS — J449 Chronic obstructive pulmonary disease, unspecified: Secondary | ICD-10-CM | POA: Diagnosis not present

## 2024-08-29 DIAGNOSIS — S7291XA Unspecified fracture of right femur, initial encounter for closed fracture: Secondary | ICD-10-CM

## 2024-08-29 DIAGNOSIS — I10 Essential (primary) hypertension: Secondary | ICD-10-CM

## 2024-08-29 HISTORY — PX: FEMUR IM NAIL: SHX1597

## 2024-08-29 LAB — CBC
HCT: 24.3 % — ABNORMAL LOW (ref 36.0–46.0)
Hemoglobin: 7.7 g/dL — ABNORMAL LOW (ref 12.0–15.0)
MCH: 31.6 pg (ref 26.0–34.0)
MCHC: 31.7 g/dL (ref 30.0–36.0)
MCV: 99.6 fL (ref 80.0–100.0)
Platelets: 164 K/uL (ref 150–400)
RBC: 2.44 MIL/uL — ABNORMAL LOW (ref 3.87–5.11)
RDW: 13.7 % (ref 11.5–15.5)
WBC: 9.4 K/uL (ref 4.0–10.5)
nRBC: 0 % (ref 0.0–0.2)

## 2024-08-29 LAB — SURGICAL PCR SCREEN
MRSA, PCR: NEGATIVE
Staphylococcus aureus: NEGATIVE

## 2024-08-29 LAB — CREATININE, SERUM
Creatinine, Ser: 1.02 mg/dL — ABNORMAL HIGH (ref 0.44–1.00)
GFR, Estimated: 55 mL/min — ABNORMAL LOW (ref >60)

## 2024-08-29 SURGERY — INSERTION, INTRAMEDULLARY ROD, FEMUR, RETROGRADE
Anesthesia: General | Laterality: Right

## 2024-08-29 MED ORDER — HALOPERIDOL LACTATE 5 MG/ML IJ SOLN
2.0000 mg | Freq: Once | INTRAMUSCULAR | Status: AC
  Administered 2024-08-29: 2 mg via INTRAVENOUS
  Filled 2024-08-29: qty 1

## 2024-08-29 MED ORDER — FENTANYL CITRATE (PF) 100 MCG/2ML IJ SOLN
INTRAMUSCULAR | Status: DC | PRN
  Administered 2024-08-29: 50 ug via INTRAVENOUS
  Administered 2024-08-29 (×2): 25 ug via INTRAVENOUS

## 2024-08-29 MED ORDER — METOCLOPRAMIDE HCL 5 MG/ML IJ SOLN: 5.0000 mg | Freq: Three times a day (TID) | INTRAMUSCULAR | Status: DC | PRN

## 2024-08-29 MED ORDER — PHENYLEPHRINE 80 MCG/ML (10ML) SYRINGE FOR IV PUSH (FOR BLOOD PRESSURE SUPPORT)
PREFILLED_SYRINGE | INTRAVENOUS | Status: DC | PRN
  Administered 2024-08-29: 80 ug via INTRAVENOUS

## 2024-08-29 MED ORDER — ALBUMIN HUMAN 5 % IV SOLN
INTRAVENOUS | Status: DC | PRN
Start: 2024-08-29 — End: 2024-08-29

## 2024-08-29 MED ORDER — CEFAZOLIN SODIUM-DEXTROSE 2-3 GM-%(50ML) IV SOLR: INTRAVENOUS | Status: DC | PRN

## 2024-08-29 MED ORDER — LIDOCAINE 2% (20 MG/ML) 5 ML SYRINGE
INTRAMUSCULAR | Status: AC
  Filled 2024-08-29: qty 5

## 2024-08-29 MED ORDER — SUGAMMADEX SODIUM 200 MG/2ML IV SOLN
INTRAVENOUS | Status: DC | PRN
  Administered 2024-08-29: 120 mg via INTRAVENOUS

## 2024-08-29 MED ORDER — ENOXAPARIN SODIUM 40 MG/0.4ML IJ SOSY: 40.0000 mg | PREFILLED_SYRINGE | INTRAMUSCULAR | Status: DC

## 2024-08-29 MED ORDER — ONDANSETRON HCL 4 MG/2ML IJ SOLN: 4.0000 mg | Freq: Four times a day (QID) | INTRAMUSCULAR | Status: DC | PRN

## 2024-08-29 MED ORDER — PROPOFOL 10 MG/ML IV BOLUS
INTRAVENOUS | Status: AC
  Filled 2024-08-29: qty 20

## 2024-08-29 MED ORDER — OXYCODONE HCL 5 MG PO TABS: 5.0000 mg | ORAL_TABLET | Freq: Once | ORAL | Status: DC | PRN

## 2024-08-29 MED ORDER — LIDOCAINE 2% (20 MG/ML) 5 ML SYRINGE
INTRAMUSCULAR | Status: DC | PRN
  Administered 2024-08-29: 40 mg via INTRAVENOUS

## 2024-08-29 MED ORDER — ACETAMINOPHEN 10 MG/ML IV SOLN
INTRAVENOUS | Status: AC
  Filled 2024-08-29: qty 100

## 2024-08-29 MED ORDER — SUCCINYLCHOLINE CHLORIDE 200 MG/10ML IV SOSY
PREFILLED_SYRINGE | INTRAVENOUS | Status: DC | PRN
  Administered 2024-08-29: 120 mg via INTRAVENOUS

## 2024-08-29 MED ORDER — FENTANYL CITRATE (PF) 100 MCG/2ML IJ SOLN
INTRAMUSCULAR | Status: AC
  Filled 2024-08-29: qty 2

## 2024-08-29 MED ORDER — ROCURONIUM BROMIDE 10 MG/ML (PF) SYRINGE
PREFILLED_SYRINGE | INTRAVENOUS | Status: DC | PRN
  Administered 2024-08-29: 40 mg via INTRAVENOUS

## 2024-08-29 MED ORDER — ACETAMINOPHEN 10 MG/ML IV SOLN
1000.0000 mg | Freq: Once | INTRAVENOUS | Status: DC | PRN
  Administered 2024-08-29: 1000 mg via INTRAVENOUS

## 2024-08-29 MED ORDER — EPHEDRINE SULFATE-NACL 50-0.9 MG/10ML-% IV SOSY
PREFILLED_SYRINGE | INTRAVENOUS | Status: DC | PRN
  Administered 2024-08-29: 5 mg via INTRAVENOUS

## 2024-08-29 MED ORDER — ONDANSETRON HCL 4 MG/2ML IJ SOLN
INTRAMUSCULAR | Status: AC
Start: 2024-08-29 — End: 2024-08-29
  Filled 2024-08-29: qty 2

## 2024-08-29 MED ORDER — ROCURONIUM BROMIDE 10 MG/ML (PF) SYRINGE
PREFILLED_SYRINGE | INTRAVENOUS | Status: AC
  Filled 2024-08-29: qty 10

## 2024-08-29 MED ORDER — CEFAZOLIN SODIUM-DEXTROSE 2-4 GM/100ML-% IV SOLN
2.0000 g | INTRAVENOUS | Status: AC
  Administered 2024-08-29: 2 g via INTRAVENOUS

## 2024-08-29 MED ORDER — EPHEDRINE 5 MG/ML INJ
INTRAVENOUS | Status: AC
  Filled 2024-08-29: qty 5

## 2024-08-29 MED ORDER — PROPOFOL 10 MG/ML IV BOLUS
INTRAVENOUS | Status: DC | PRN
  Administered 2024-08-29: 60 mg via INTRAVENOUS

## 2024-08-29 MED ORDER — LACTATED RINGERS IV SOLN: INTRAVENOUS | Status: DC

## 2024-08-29 MED ORDER — OXYCODONE HCL 5 MG/5ML PO SOLN: 5.0000 mg | Freq: Once | ORAL | Status: DC | PRN

## 2024-08-29 MED ORDER — METOCLOPRAMIDE HCL 5 MG PO TABS: 5.0000 mg | ORAL_TABLET | Freq: Three times a day (TID) | ORAL | Status: DC | PRN

## 2024-08-29 MED ORDER — DOCUSATE SODIUM 100 MG PO CAPS
100.0000 mg | ORAL_CAPSULE | Freq: Two times a day (BID) | ORAL | Status: DC
  Administered 2024-08-29: 100 mg via ORAL
  Filled 2024-08-29 (×2): qty 1

## 2024-08-29 MED ORDER — ONDANSETRON HCL 4 MG PO TABS: 4.0000 mg | ORAL_TABLET | Freq: Four times a day (QID) | ORAL | Status: DC | PRN

## 2024-08-29 MED ORDER — 0.9 % SODIUM CHLORIDE (POUR BTL) OPTIME
TOPICAL | Status: DC | PRN
  Administered 2024-08-29: 1000 mL

## 2024-08-29 MED ORDER — DEXAMETHASONE SOD PHOSPHATE PF 10 MG/ML IJ SOLN
INTRAMUSCULAR | Status: DC | PRN
  Administered 2024-08-29: 5 mg via INTRAVENOUS

## 2024-08-29 MED ORDER — POVIDONE-IODINE 10 % EX SWAB
2.0000 | Freq: Once | CUTANEOUS | Status: AC
  Administered 2024-08-29: 2 via TOPICAL

## 2024-08-29 MED ORDER — ONDANSETRON HCL 4 MG/2ML IJ SOLN
INTRAMUSCULAR | Status: DC | PRN
  Administered 2024-08-29: 4 mg via INTRAVENOUS

## 2024-08-29 MED ORDER — LABETALOL HCL 5 MG/ML IV SOLN
INTRAVENOUS | Status: DC | PRN
  Administered 2024-08-29: 5 mg via INTRAVENOUS

## 2024-08-29 MED ORDER — CEFAZOLIN SODIUM-DEXTROSE 2-4 GM/100ML-% IV SOLN
2.0000 g | Freq: Four times a day (QID) | INTRAVENOUS | Status: AC
  Administered 2024-08-29 (×2): 2 g via INTRAVENOUS
  Filled 2024-08-29 (×2): qty 100

## 2024-08-29 MED ORDER — CEFAZOLIN SODIUM-DEXTROSE 2-4 GM/100ML-% IV SOLN
INTRAVENOUS | Status: AC
  Filled 2024-08-29: qty 100

## 2024-08-29 MED ORDER — SUCCINYLCHOLINE CHLORIDE 200 MG/10ML IV SOSY
PREFILLED_SYRINGE | INTRAVENOUS | Status: AC
Start: 2024-08-29 — End: 2024-08-29
  Filled 2024-08-29: qty 10

## 2024-08-29 MED ORDER — FENTANYL CITRATE (PF) 100 MCG/2ML IJ SOLN: 25.0000 ug | INTRAMUSCULAR | Status: DC | PRN

## 2024-08-29 MED ORDER — CHLORHEXIDINE GLUCONATE 4 % EX SOLN
60.0000 mL | Freq: Once | CUTANEOUS | Status: AC
  Administered 2024-08-29: 4 via TOPICAL

## 2024-08-29 MED ORDER — CHLORHEXIDINE GLUCONATE 0.12 % MT SOLN
OROMUCOSAL | Status: AC
  Filled 2024-08-29: qty 15

## 2024-08-29 SURGICAL SUPPLY — 51 items
BIT DRILL 4.0X165 AO STYLE (BIT) IMPLANT
BIT DRILL CALIBRATED AO 5.5 (DRILL) IMPLANT
BLADE SURG 10 STRL SS (BLADE) ×1 IMPLANT
BNDG COHESIVE 6X5 TAN NS LF (GAUZE/BANDAGES/DRESSINGS) ×1 IMPLANT
BNDG ELASTIC 6X10 VLCR STRL LF (GAUZE/BANDAGES/DRESSINGS) IMPLANT
CHLORAPREP W/TINT 26 (MISCELLANEOUS) ×1 IMPLANT
CLEANER TIP ELECTROSURG 2X2 (MISCELLANEOUS) ×1 IMPLANT
COVER SURGICAL LIGHT HANDLE (MISCELLANEOUS) ×1 IMPLANT
DRAPE C-ARM 42X72 X-RAY (DRAPES) ×1 IMPLANT
DRAPE HALF SHEET 40X57 (DRAPES) ×2 IMPLANT
DRAPE IMP U-DRAPE 54X76 (DRAPES) ×1 IMPLANT
DRAPE SURG ORHT 6 SPLT 77X108 (DRAPES) ×2 IMPLANT
DRAPE U-SHAPE 47X51 STRL (DRAPES) ×1 IMPLANT
DRESSING MEPILEX FLEX 4X4 (GAUZE/BANDAGES/DRESSINGS) ×3 IMPLANT
ELECTRODE REM PT RTRN 9FT ADLT (ELECTROSURGICAL) ×1 IMPLANT
GAUZE PAD ABD 8X10 STRL (GAUZE/BANDAGES/DRESSINGS) IMPLANT
GLOVE BIO SURGEON STRL SZ 6.5 (GLOVE) ×2 IMPLANT
GLOVE BIOGEL PI IND STRL 6.5 (GLOVE) ×1 IMPLANT
GLOVE BIOGEL PI IND STRL 8 (GLOVE) ×1 IMPLANT
GLOVE SURG ORTHO 8.0 STRL STRW (GLOVE) ×2 IMPLANT
GOWN STRL REUS W/ TWL XL LVL3 (GOWN DISPOSABLE) ×3 IMPLANT
GUIDEWIRE ORTH 900X3XBALL NOSE (WIRE) IMPLANT
KIT BASIN OR (CUSTOM PROCEDURE TRAY) ×1 IMPLANT
KIT TURNOVER KIT B (KITS) ×1 IMPLANT
MANIFOLD NEPTUNE II (INSTRUMENTS) ×1 IMPLANT
NAIL 11X32 FEMORAL RETROGRADE (Nail) IMPLANT
PACK ORTHO EXTREMITY (CUSTOM PROCEDURE TRAY) ×1 IMPLANT
PAD ARMBOARD POSITIONER FOAM (MISCELLANEOUS) ×2 IMPLANT
PENCIL BUTTON HOLSTER BLD 10FT (ELECTRODE) IMPLANT
PIN GUIDE AO 3.2X381 (ORTHOPEDIC DISPOSABLE SUPPLIES) IMPLANT
SCREW CANC CAPT FT 6.5X70 (Screw) IMPLANT
SCREW CANC CAPT FT 6.5X80 (Screw) IMPLANT
SCREW CORT CAPT FT 5.0X36 (Screw) IMPLANT
SOLN 0.9% NACL POUR BTL 1000ML (IV SOLUTION) ×1 IMPLANT
SOLN STERILE WATER BTL 1000 ML (IV SOLUTION) ×1 IMPLANT
SPONGE T-LAP 18X18 ~~LOC~~+RFID (SPONGE) ×1 IMPLANT
STAPLER SKIN PROX 35W (STAPLE) ×1 IMPLANT
STOCKINETTE IMPERVIOUS 9X36 MD (GAUZE/BANDAGES/DRESSINGS) ×1 IMPLANT
SUCTION TUBE FRAZIER 10FR DISP (SUCTIONS) ×1 IMPLANT
SUT MNCRL AB 3-0 PS2 18 (SUTURE) ×1 IMPLANT
SUT MNCRL AB 3-0 PS2 27 (SUTURE) IMPLANT
SUT VIC AB 0 CT1 27XBRD ANBCTR (SUTURE) ×1 IMPLANT
SUT VIC AB 2-0 CT1 TAPERPNT 27 (SUTURE) IMPLANT
SUT VIC AB 2-0 CTB1 (SUTURE) ×1 IMPLANT
SUT VIC AB 3-0 SH 27X BRD (SUTURE) IMPLANT
SYR BULB IRRIG 60ML STRL (SYRINGE) ×1 IMPLANT
SYR CONTROL 10ML LL (SYRINGE) ×1 IMPLANT
TOWEL GREEN STERILE (TOWEL DISPOSABLE) ×1 IMPLANT
TOWEL GREEN STERILE FF (TOWEL DISPOSABLE) ×1 IMPLANT
TOWEL OR 17X26 10 PK STRL BLUE (TOWEL DISPOSABLE) ×2 IMPLANT
TUBE CONNECTING 12X1/4 (SUCTIONS) ×1 IMPLANT

## 2024-08-29 NOTE — Transfer of Care (Signed)
 Immediate Anesthesia Transfer of Care Note  Patient: Cheryl Olson  Procedure(s) Performed: INSERTION, INTRAMEDULLARY ROD, FEMUR, RETROGRADE (Right)  Patient Location: PACU  Anesthesia Type:General  Level of Consciousness: drowsy  Airway & Oxygen Therapy: Patient Spontanous Breathing and Patient connected to nasal cannula oxygen  Post-op Assessment: Report given to RN and Post -op Vital signs reviewed and stable  Post vital signs: Reviewed and stable  Last Vitals:  Vitals Value Taken Time  BP 135/56 08/29/24 11:45  Temp    Pulse 65 08/29/24 11:51  Resp 9 08/29/24 11:51  SpO2 100 % 08/29/24 11:51  Vitals shown include unfiled device data.  Last Pain:  Vitals:   08/29/24 0716  TempSrc: Oral  PainSc:          Complications: No notable events documented.

## 2024-08-29 NOTE — Progress Notes (Signed)
 Patient has been taken off of floor for surgical procedure.  CHG performed prior to leaving by Nursing Assistant.  Upper and lower dentures removed by granddaughter.  Watch removed by granddaughter.    Tele Box removed.  Central Telemetry notified of patient leaving floor for surgical procedure.  Granddaughter joined patient in pre-op to answer any additional questions as patient is confused x4.

## 2024-08-29 NOTE — Progress Notes (Signed)
 RN called Pts daughter  [name] to ask for verbal consent for IM nailing in the morning. RN left a voicemail and also spoke with Pts granddaughter at the bedside to confirmed  [name]  is POA. RN will continue to follow up.

## 2024-08-29 NOTE — Progress Notes (Signed)
 PROGRESS NOTE    Cheryl Olson  FMW:[id number] DOB: 04/04/1941 DOA: 08/28/2024 PCP: Cheryl Hamilton, MD   Brief Narrative: Cheryl Olson is a 83 y.o. female with a history of dementia and COPD.  Patient presented secondary to being found down after an unwitnessed fall and found to have a right femur fracture. Syncope workup started and orthopedic surgery consulted for management of fracture..   Assessment and Plan:  Possible syncope Unwitnessed fall. Complicated by underlying dementia as patient cannot give a history, so syncope could not be ruled out. Transthoracic Echocardiogram ordered and is with LVEF >70% and no aortic stenosis. EEG ordered and declined per family. UDS ordered but not completed. Alcohol level undetectable. Orthostatics deferred secondary to leg fracture. Per family, patient has a history of vasovagal syncope, which could be the etiology. -Continue telemetry -PT/OT eval  Right femur fracture Secondary to unwitnessed fall. Orthopedic surgery consulted with plan for surgical management 11/8.  COPD -Continue Duonebs as needed  Dementia Noted. Patient lives at a memory care facility. -Continue Aricept , Depakote, Lexapro, Namenda    DVT prophylaxis: Per orthopedic surgery Code Status:   Code Status: Full Code Family Communication: Granddaughter at bedside Disposition Plan: Discharge back to memory care vs SNF pending orthopedic surgery management/recommendations and PT/OT recommendations   Consultants:  Orthopedic surgery  Procedures:  None  Antimicrobials: None    Subjective: Patient without specific concerns at this time. Awaiting surgery in pre-op  Objective: BP (!) 183/85 (BP Location: Right Arm)   Pulse 69   Temp 98.9 F (37.2 C) (Oral)   Resp 16   Ht 5' 1 (1.549 m)   Wt 59 kg   SpO2 96%   BMI 24.56 kg/m   Examination:  General exam: Appears calm and comfortable. Respiratory system: Clear to auscultation. Respiratory effort  normal. Cardiovascular system: S1 & S2 heard, RRR. Gastrointestinal system: Abdomen is nondistended, soft and nontender. Normal bowel sounds heard. Central nervous system: Alert. Musculoskeletal: No calf tenderness Skin: No cyanosis. No rashes   Data Reviewed: I have personally reviewed following labs and imaging studies  CBC Lab Results  Component Value Date   WBC 6.6 08/28/2024   RBC 3.24 (L) 08/28/2024   HGB 10.2 (L) 08/28/2024   HCT 31.8 (L) 08/28/2024   MCV 98.1 08/28/2024   MCH 31.5 08/28/2024   PLT 162 08/28/2024   MCHC 32.1 08/28/2024   RDW 13.2 08/28/2024   LYMPHSABS 0.8 08/28/2024   MONOABS 0.6 08/28/2024   EOSABS 0.1 08/28/2024   BASOSABS 0.1 08/28/2024     Last metabolic panel Lab Results  Component Value Date   NA 138 08/28/2024   K 3.5 08/28/2024   CL 98 08/28/2024   CO2 25 08/28/2024   BUN 17 08/28/2024   CREATININE 0.98 08/28/2024   GLUCOSE 99 08/28/2024   GFRNONAA 58 (L) 08/28/2024   GFRAA >60 11/14/2017   CALCIUM 8.9 08/28/2024   PROT 6.2 (L) 08/28/2024   ALBUMIN 3.7 08/28/2024   BILITOT 0.9 08/28/2024   ALKPHOS 59 08/28/2024   AST 37 08/28/2024   ALT 15 08/28/2024   ANIONGAP 12 08/28/2024    GFR: Estimated Creatinine Clearance: 36.5 mL/min (by C-G formula based on SCr of 0.98 mg/dL).  Recent Results (from the past 240 hours)  Culture, blood (Routine X 2) w Reflex to ID Panel     Status: None (Preliminary result)   Collection Time: 08/28/24  7:04 PM   Specimen: BLOOD LEFT ARM  Result Value Ref Range Status  Specimen Description BLOOD LEFT ARM  Final   Special Requests   Final    BOTTLES DRAWN AEROBIC AND ANAEROBIC Blood Culture results may not be optimal due to an inadequate volume of blood received in culture bottles   Culture   Final    NO GROWTH < 12 HOURS Performed at [facility], [address] [place], [place] [address]    Report Status PENDING  Incomplete  Culture, blood (Routine X 2) w Reflex to ID Panel     Status:  None (Preliminary result)   Collection Time: 08/28/24  7:09 PM   Specimen: BLOOD LEFT HAND  Result Value Ref Range Status   Specimen Description BLOOD LEFT HAND  Final   Special Requests   Final    BOTTLES DRAWN AEROBIC AND ANAEROBIC Blood Culture adequate volume   Culture  Setup Time NO ORGANISMS SEEN ANAEROBIC BOTTLE ONLY   Final   Culture   Final    NO GROWTH < 12 HOURS Performed at [facility], [address] [place], [place] [address]    Report Status PENDING  Incomplete  Surgical pcr screen     Status: None   Collection Time: 08/29/24 12:40 AM   Specimen: Nasal Mucosa; Nasal Swab  Result Value Ref Range Status   MRSA, PCR NEGATIVE NEGATIVE Final   Staphylococcus aureus NEGATIVE NEGATIVE Final    Comment: (NOTE) The Xpert SA Assay (FDA approved for NASAL specimens in patients 65 years of age and older), is one component of a comprehensive surveillance program. It is not intended to diagnose infection nor to guide or monitor treatment. Performed at [facility], [address] [place], [place] [address]       Radiology Studies: ECHOCARDIOGRAM COMPLETE BUBBLE STUDY Result Date: 08/28/2024    ECHOCARDIOGRAM REPORT   Patient Name:   Cheryl Olson Date of Exam: 08/28/2024 Medical Rec #:  [id number]         Height:       61.0 in Accession #:    [id number]        Weight:       130.0 lb Date of Birth:  12-06-1940         BSA:          1.573 m Patient Age:    82 years          BP:           168/57 mmHg Patient Gender: F                 HR:           65 bpm. Exam Location:  Inpatient Procedure: 2D Echo, Cardiac Doppler, Color Doppler and Saline Contrast Bubble            Study (Both Spectral and Color Flow Doppler were utilized during            procedure). Indications:    Syncope 780.2/R55  History:        Patient has no prior history of Echocardiogram examinations.                 COPD, Signs/Symptoms:Syncope and Dizziness/Lightheadedness; Risk                  Factors:Hypertension.  Sonographer:    Koleen Popper RDCS Referring Phys: [id number] Cheryl Olson  Sonographer Comments: Image acquisition challenging due to patient body habitus. IMPRESSIONS  1. Left ventricular ejection fraction, by estimation, is >  75%. The left ventricle has hyperdynamic function. The left ventricle has no regional wall motion abnormalities. Left ventricular diastolic parameters were normal.  2. Right ventricular systolic function is normal. The right ventricular size is normal. Mildly increased right ventricular wall thickness. There is normal pulmonary artery systolic pressure. The estimated right ventricular systolic pressure is 31.1 mmHg.  3. The mitral valve is grossly normal. No evidence of mitral valve regurgitation. No evidence of mitral stenosis.  4. The aortic valve is tricuspid. Aortic valve regurgitation is mild. Aortic valve sclerosis is present, with no evidence of aortic valve stenosis.  5. The inferior vena cava is normal in size with greater than 50% respiratory variability, suggesting right atrial pressure of 3 mmHg.  6. Agitated saline contrast bubble study was negative, with no evidence of any interatrial shunt. Comparison(s): No prior Echocardiogram. FINDINGS  Left Ventricle: Left ventricular ejection fraction, by estimation, is >75%. The left ventricle has hyperdynamic function. The left ventricle has no regional wall motion abnormalities. The left ventricular internal cavity size was normal in size. There is no left ventricular hypertrophy of the basal-septal segment. Left ventricular diastolic parameters were normal. Right Ventricle: The right ventricular size is normal. Mildly increased right ventricular wall thickness. Right ventricular systolic function is normal. There is normal pulmonary artery systolic pressure. The tricuspid regurgitant velocity is 2.65 m/s, and with an assumed right atrial pressure of 3 mmHg, the estimated right ventricular systolic pressure is 31.1  mmHg. Left Atrium: Left atrial size was normal in size. Right Atrium: Right atrial size was normal in size. Pericardium: There is no evidence of pericardial effusion. Mitral Valve: The mitral valve is grossly normal. No evidence of mitral valve regurgitation. No evidence of mitral valve stenosis. Tricuspid Valve: The tricuspid valve is normal in structure. Tricuspid valve regurgitation is mild . No evidence of tricuspid stenosis. Aortic Valve: The aortic valve is tricuspid. Aortic valve regurgitation is mild. Aortic valve sclerosis is present, with no evidence of aortic valve stenosis. Pulmonic Valve: The pulmonic valve was normal in structure. Pulmonic valve regurgitation is mild. No evidence of pulmonic stenosis. Aorta: The aortic root and ascending aorta are structurally normal, with no evidence of dilitation. Venous: The inferior vena cava is normal in size with greater than 50% respiratory variability, suggesting right atrial pressure of 3 mmHg. IAS/Shunts: No atrial level shunt detected by color flow Doppler. Agitated saline contrast was given intravenously to evaluate for intracardiac shunting. Agitated saline contrast bubble study was negative, with no evidence of any interatrial shunt.  LEFT VENTRICLE PLAX 2D LVIDd:         4.00 cm   Diastology LVIDs:         2.10 cm   LV e' medial:    7.62 cm/s LV PW:         1.10 cm   LV E/e' medial:  10.5 LV IVS:        1.30 cm   LV e' lateral:   6.40 cm/s LVOT diam:     1.83 cm   LV E/e' lateral: 12.5 LV SV:         57 LV SV Index:   36 LVOT Area:     2.63 cm  RIGHT VENTRICLE             IVC RV S prime:     16.50 cm/s  IVC diam: 1.58 cm TAPSE (M-mode): 1.8 cm LEFT ATRIUM             Index LA  diam:        3.18 cm 2.02 cm/m LA Vol (A2C):   41.5 ml 26.39 ml/m LA Vol (A4C):   41.1 ml 26.13 ml/m LA Biplane Vol: 41.8 ml 26.58 ml/m  AORTIC VALVE LVOT Vmax:   102.00 cm/s LVOT Vmean:  68.700 cm/s LVOT VTI:    0.216 m  AORTA Ao Root diam: 3.01 cm Ao Asc diam:  3.32 cm MITRAL  VALVE               TRICUSPID VALVE MV Area (PHT): 3.37 cm    TR Peak grad:   28.1 mmHg MV Decel Time: 225 msec    TR Vmax:        265.00 cm/s MV E velocity: 80.20 cm/s MV A velocity: 89.60 cm/s  SHUNTS MV E/A ratio:  0.90        Systemic VTI:  0.22 m                            Systemic Diam: 1.83 cm Sunit Tolia Electronically signed by Madonna Large Signature Date/Time: 08/28/2024/8:50:43 PM    Final    CT FEMUR RIGHT WO CONTRAST Result Date: 08/28/2024 EXAM: CT OF THE RIGHT FEMUR, WITHOUT IV CONTRAST 08/28/2024 12:43:30 PM TECHNIQUE: Axial images were acquired through the right femur without IV contrast. Reformatted images were reviewed. Automated exposure control, iterative reconstruction, and/or weight based adjustment of the mA/kV was utilized to reduce the radiation dose to as low as reasonably achievable. COMPARISON: 08/28/2024 CLINICAL HISTORY: Femur fracture. FINDINGS: BONES: Comminuted spiral fracture of the distal femoral metadiaphysis including a 9.6 cm in length medial intermediary fragment which is mildly medially displaced. The proximal fragment overlaps the distal fragment by about 4.4 cm. Deformity of the pubic rami compatible with old fractures. Remote left sacral fracture. 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 Moderate osteoarthritis of the right knee. Small knee joint effusion. No dislocation. SOFT TISSUES: Small amount of edema or hematoma along the upper popliteal region. Expected indistinctness of fascial planes around the fracture site. Atheromatous vascular calcifications. IMPRESSION: 1. Comminuted spiral fracture of the distal femoral metadiaphysis with medial intermediary fragment, mild medial displacement, and 4.4 cm fragment overlap. 2. 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 3. Moderate right knee osteoarthritis. 4. Deformities of the pubic rami consistent with old fractures and remote left sacral fracture. Electronically signed by: Ryan Salvage MD  08/28/2024 12:50 PM EST RP Workstation: [id number]   DG Chest 1 View Result Date: 08/28/2024 EXAM: 1 VIEW(S) XRAY OF THE CHEST 08/28/2024 09:45:00 AM COMPARISON: 05/23/2023 CLINICAL HISTORY: [id number] Fall [id number] FINDINGS: LUNGS AND PLEURA: Mild streaky bibasilar scarring versus atelectasis. Chronic elevation of the right hemidiaphragm. No focal pulmonary opacity. No pulmonary edema. No pleural effusion. No pneumothorax. HEART AND MEDIASTINUM: Atherosclerotic plaque noted. No acute abnormality of the cardiac and mediastinal silhouettes. BONES AND SOFT TISSUES: No acute osseous abnormality. IMPRESSION: 1. Mild bibasilar atelectasis or scarring. 2. Chronic elevation of the right hemidiaphragm. 3. Aortic Atherosclerosis (ICD10-I70.0). Electronically signed by: Selinda Blue MD 08/28/2024 10:21 AM EST RP Workstation: [id number]   CT Cervical Spine Wo Contrast Result Date: 08/28/2024 EXAM: CT CERVICAL SPINE WITHOUT CONTRAST 08/28/2024 10:08:51 AM TECHNIQUE: CT of the cervical spine was performed without the administration of intravenous contrast. Multiplanar reformatted images are provided for review. Automated exposure control, iterative reconstruction, and/or weight based adjustment of the mA/kV was utilized to reduce the radiation dose to as low as reasonably achievable. COMPARISON: CT cervical  spine 11/13/2023. CLINICAL HISTORY: Polytrauma, blunt. FINDINGS: CERVICAL SPINE: BONES AND ALIGNMENT: Chronic straightening/mild reversal of the normal cervical lordosis with grade 1 anterolisthesis of C3 on C4 and C4 on C5. No acute fracture or suspicious lesion. DEGENERATIVE CHANGES: Moderate C1-C2 degenerative changes. Widespread, advanced cervical and upper thoracic facet arthrosis. Posterior element ankylosis at C2-C3. Advanced lower cervical disc degeneration. No suspected high grade spinal canal stenosis. At least moderate multilevel neural foraminal stenosis. SOFT TISSUES: No prevertebral soft tissue swelling.  IMPRESSION: 1. No acute cervical spine fracture or traumatic malalignment. 2. Advanced disc and facet degeneration. Electronically signed by: Dasie Hamburg MD 08/28/2024 10:20 AM EST RP Workstation: [id number]   DG FEMUR, MIN 2 VIEWS RIGHT Result Date: 08/28/2024 EXAM: 2 VIEW(S) XRAY OF THE RIGHT FEMUR 08/28/2024 09:46:00 AM COMPARISON: None available. CLINICAL HISTORY: Pain FINDINGS: BONES AND JOINTS: Acute comminuted spiral fracture of the distal right femoral diaphysis, with large 13 x 2 cm butterfly fragment medially, with 3 cm overriding, with 17 mm lateral displacement of the dominant distal fracture fragment, and mild apex lateral angulation. No joint dislocation. SOFT TISSUES: The soft tissues are unremarkable. IMPRESSION: 1. Acute comminuted spiral fracture of the distal right femoral diaphysis with a large 13 x 2 cm medial butterfly fragment, 3 cm overriding, 17 mm lateral displacement of the distal fragment, and mild apex lateral angulation. Electronically signed by: Selinda Blue MD 08/28/2024 10:17 AM EST RP Workstation: [id number]   CT Head Wo Contrast Result Date: 08/28/2024 EXAM: CT HEAD WITHOUT CONTRAST 08/28/2024 10:08:51 AM TECHNIQUE: CT of the head was performed without the administration of intravenous contrast. Automated exposure control, iterative reconstruction, and/or weight based adjustment of the mA/kV was utilized to reduce the radiation dose to as low as reasonably achievable. COMPARISON: Head CT 11/13/2023. CLINICAL HISTORY: Head trauma, minor (Age >= 65y). FINDINGS: BRAIN AND VENTRICLES: There is no evidence of an acute infarct, intracranial hemorrhage, mass, midline shift, hydrocephalus, or extra-axial fluid collection. Patchy cerebral white matter hypodensities are similar to the prior study and nonspecific but compatible with mild to moderate chronic small vessel ischemic disease. Cerebral atrophy is unchanged with prominent bilateral mesial temporal lobe volume loss again noted.  Calcified atherosclerosis at the skull base. ORBITS: Bilateral cataract extraction. SINUSES: No acute abnormality. SOFT TISSUES AND SKULL: No acute soft tissue abnormality. No skull fracture. IMPRESSION: 1. No acute intracranial abnormality. 2. Mild to moderate chronic small vessel ischemic disease. Electronically signed by: Dasie Hamburg MD 08/28/2024 10:17 AM EST RP Workstation: [id number]   DG Pelvis 1-2 Views Result Date: 08/28/2024 EXAM: 1 or 2 VIEW(S) XRAY OF THE PELVIS 08/28/2024 09:45:00 AM COMPARISON: Unenhanced CT abdomen/pelvis dated 11/14/2017. CLINICAL HISTORY: [id number] Trauma [id number] FINDINGS: BONES AND JOINTS: Healed deformities of the superior and inferior pubic rami bilaterally, unchanged since the 2019 CT. Degenerative changes of the lower lumbar spine. Mild degenerative changes of the RIGHT hip. No acute fracture or diastasis. No joint dislocation. SOFT TISSUES: The soft tissues are unremarkable. IMPRESSION: 1. No acute osseous abnormality in the pelvis. 2. Degenerative changes of the lower lumbar spine. 3. Mild degenerative changes of the right hip. Electronically signed by: Selinda Blue MD 08/28/2024 10:10 AM EST RP Workstation: [id number]      LOS: 1 day    Elgin Lam, MD [facility] 08/29/2024, 8:40 AM   If 7PM-7AM, please contact night-coverage [email or web address]

## 2024-08-29 NOTE — Anesthesia Procedure Notes (Signed)
 Procedure Name: Intubation Date/Time: 08/29/2024 9:49 AM  Performed by: Roddie Grate, CRNAPre-anesthesia Checklist: Patient identified, Emergency Drugs available, Suction available, Patient being monitored and Timeout performed Patient Re-evaluated:Patient Re-evaluated prior to induction Oxygen Delivery Method: Circle system utilized Preoxygenation: Pre-oxygenation with 100% oxygen Induction Type: IV induction, Rapid sequence and Cricoid Pressure applied Laryngoscope Size: Mac and 3 Grade View: Grade I Tube type: Oral Tube size: 6.5 mm Number of attempts: 1 Airway Equipment and Method: Stylet Placement Confirmation: ETT inserted through vocal cords under direct vision, positive ETCO2 and breath sounds checked- equal and bilateral Secured at: 21 cm Tube secured with: Tape Dental Injury: Teeth and Oropharynx as per pre-operative assessment  Comments: Smooth IV Induction. Eyes taped. RSI Performed. DL x 1 with grade 1 view. Atraumatically placed, teeth and lip remain intact as pre-op. Secured with tape. Bilateral breath sounds +/=, EtCO2 +, Adequate TV.

## 2024-08-29 NOTE — Plan of Care (Signed)
  Problem: Education: Goal: Knowledge of General Education information will improve Description: Including pain rating scale, medication(s)/side effects and non-pharmacologic comfort measures Outcome: Progressing   Problem: Health Behavior/Discharge Planning: Goal: Ability to manage health-related needs will improve Outcome: Progressing   Problem: Clinical Measurements: Goal: Ability to maintain clinical measurements within normal limits will improve Outcome: Progressing Goal: Will remain free from infection Outcome: Progressing Goal: Diagnostic test results will improve Outcome: Progressing Goal: Respiratory complications will improve Outcome: Progressing Goal: Cardiovascular complication will be avoided Outcome: Progressing   Problem: Activity: Goal: Risk for activity intolerance will decrease Outcome: Progressing   Problem: Nutrition: Goal: Adequate nutrition will be maintained Outcome: Progressing   Problem: Elimination: Goal: Will not experience complications related to bowel motility Outcome: Progressing Goal: Will not experience complications related to urinary retention Outcome: Progressing   Problem: Pain Managment: Goal: General experience of comfort will improve and/or be controlled Outcome: Progressing   Problem: Skin Integrity: Goal: Risk for impaired skin integrity will decrease Outcome: Progressing

## 2024-08-29 NOTE — Op Note (Signed)
 Cheryl Olson female 83 y.o. 08/29/2024  PreOperative Diagnosis: Right distal femur fracture  PostOperative Diagnosis: Same  PROCEDURE: Intramedullary nailing of right distal femur fracture, retrograde  SURGEON: Cheryl Pae, MD  ASSISTANT: Cheryl Jordan, PA-C was necessary for patient positioning, prep, assistance with fracture reduction and placement of hardware  ANESTHESIA: General anesthesia  FINDINGS: Comminuted distal femur fracture  IMPLANTS: Arthrex retrograde femoral nail 11 x 56  INDICATIONS:82 y.o. female sustained the above injury after a fall.  She was indicated for surgery due to baseline ambulatory status.   Patient understood the risks, benefits and alternatives to surgery which include but are not limited to wound healing complications, infection, nonunion, malunion, need for further surgery as well as damage to surrounding structures. They also understood the potential for continued pain in that there were no guarantees of acceptable outcome After weighing these risks the patient opted to proceed with surgery.  PROCEDURE: Patient was identified in the preoperative holding area.  The right leg was marked by myself.  Consent was signed by myself and the patient.   Patient was taken to the operative suite and placed supine on the operative table.  General endotracheal tube anesthesia was induced without difficulty.  Preoperative antibiotics were given. The extremity was prepped and draped in the usual sterile fashion and surgical timeout was performed.    Triangle was placed onto the knee.  X-rays of the fracture site were then taken and there was acceptable length.  We then made an incision at the distal aspect of the patella through the patellar tendon.  The insertion pin was then placed at the distal femur and confirmed fluoroscopically to be positioned correctly.  Then the pin was advanced.  Then the entry reamer was used to gain entry to the distal aspect  of the femur.  Then the finger reducer was placed and it was run across the fracture site without difficulty.  Fluoroscopy confirmed acceptable position of the finger reducer and the fracture in the AP and lateral planes.  Then the guidewire was placed through the finger reducer up to the level of the proximal femur.  Then the length was measured.  Then a 10 mm reamer was run up the canal and there was no significant chatter.  Nail size was chosen.  Then the nail was placed uneventfully.  We are able to gain traction and rotational positioning manually that was confirmed fluoroscopically.  Then 3 distal interlocking screws were placed without difficulty.  Then position was confirmed fluoroscopically again and we used AP of the knee and lesser trochanter position to gauge rotation.  Length was chosen using fracture read which was nearly anatomic along the lateral cortex.  Then the proximal interlocking screw was placed in a standard fashion using perfect circle technique.  Final fluoroscopic images were obtained.  Wounds were irrigated with normal saline and closed with a 2-0 Vicryl, 3-0 Monocryl and staples.  Soft dressing was placed.  She was then awakened from anesthesia and taken recovery in stable condition.  No complications.  She tolerated the procedure well.   POST OPERATIVE INSTRUCTIONS: Touchdown weightbearing to operative extremity Keep dressing in place She will follow-up in 2 weeks for x-rays of the femur and staple removal if appropriate.   TOURNIQUET TIME: No tourniquet  BLOOD LOSS:  200 mL         DRAINS: none         SPECIMEN: none       COMPLICATIONS:  * No complications entered  in OR log *         Disposition: PACU - hemodynamically stable.         Condition: stable
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
## 2024-08-29 NOTE — Hospital Course (Signed)
 Cheryl Olson is a 83 y.o. female with a history of dementia and COPD.  Patient presented secondary to being found down after an unwitnessed fall and found to have a right femur fracture. Syncope workup started and orthopedic surgery consulted for management of fracture.SABRA

## 2024-08-29 NOTE — Plan of Care (Signed)

## 2024-08-29 NOTE — Anesthesia Preprocedure Evaluation (Addendum)
 Anesthesia Evaluation  Patient identified by MRN, date of birth, ID band Patient confused    Reviewed: Allergy & Precautions, NPO status , Patient's Chart, lab work & pertinent test results  History of Anesthesia Complications Negative for: history of anesthetic complications  Airway Mallampati: Unable to assess       Dental  (+) Dental Advisory Given   Pulmonary asthma , COPD, former smoker   breath sounds clear to auscultation       Cardiovascular hypertension,  Rhythm:Regular  1. Left ventricular ejection fraction, by estimation, is >75%. The left  ventricle has hyperdynamic function. The left ventricle has no regional  wall motion abnormalities. Left ventricular diastolic parameters were  normal.   2. Right ventricular systolic function is normal. The right ventricular  size is normal. Mildly increased right ventricular wall thickness. There  is normal pulmonary artery systolic pressure. The estimated right  ventricular systolic pressure is 31.1  mmHg.   3. The mitral valve is grossly normal. No evidence of mitral valve  regurgitation. No evidence of mitral stenosis.   4. The aortic valve is tricuspid. Aortic valve regurgitation is mild.  Aortic valve sclerosis is present, with no evidence of aortic valve  stenosis.   5. The inferior vena cava is normal in size with greater than 50%  respiratory variability, suggesting right atrial pressure of 3 mmHg.   6. Agitated saline contrast bubble study was negative, with no evidence  of any interatrial shunt.     Neuro/Psych  PSYCHIATRIC DISORDERS     Dementia    GI/Hepatic negative GI ROS, Neg liver ROS,,,  Endo/Other  negative endocrine ROS    Renal/GU Lab Results      Component                Value               Date                      NA                       138                 08/28/2024                K                        3.5                 08/28/2024                 CO2                      25                  08/28/2024                GLUCOSE                  99                  08/28/2024                BUN                      17  08/28/2024                CREATININE               0.98                08/28/2024                CALCIUM                  8.9                 08/28/2024                GFRNONAA                 58 (L)              08/28/2024                Musculoskeletal   Abdominal   Peds  Hematology  (+) Blood dyscrasia, anemia Lab Results      Component                Value               Date                      WBC                      6.6                 08/28/2024                HGB                      10.2 (L)            08/28/2024                HCT                      31.8 (L)            08/28/2024                MCV                      98.1                08/28/2024                PLT                      162                 08/28/2024           \   Anesthesia Other Findings   Reproductive/Obstetrics                              Anesthesia Physical Anesthesia Plan  ASA: 3  Anesthesia Plan: General   Post-op Pain Management: Ofirmev  IV (intra-op)*   Induction: Intravenous, Rapid sequence and Cricoid pressure planned  PONV Risk Score and Plan: 4 or greater and Ondansetron  and Dexamethasone  Airway Management Planned: Oral ETT  Additional Equipment: None  Intra-op Plan:   Post-operative Plan: Extubation in OR  Informed Consent:  I have reviewed the patients History and Physical, chart, labs and discussed the procedure including the risks, benefits and alternatives for the proposed anesthesia with the patient or authorized representative who has indicated his/her understanding and acceptance.     Dental advisory given  Plan Discussed with: CRNA  Anesthesia Plan Comments:          Anesthesia Quick Evaluation

## 2024-08-30 DIAGNOSIS — S7291XA Unspecified fracture of right femur, initial encounter for closed fracture: Secondary | ICD-10-CM | POA: Diagnosis not present

## 2024-08-30 DIAGNOSIS — F03918 Unspecified dementia, unspecified severity, with other behavioral disturbance: Secondary | ICD-10-CM | POA: Diagnosis not present

## 2024-08-30 LAB — COMPREHENSIVE METABOLIC PANEL WITH GFR
ALT: 62 U/L — ABNORMAL HIGH (ref 0–44)
AST: 80 U/L — ABNORMAL HIGH (ref 15–41)
Albumin: 2.8 g/dL — ABNORMAL LOW (ref 3.5–5.0)
Alkaline Phosphatase: 62 U/L (ref 38–126)
Anion gap: 10 (ref 5–15)
BUN: 28 mg/dL — ABNORMAL HIGH (ref 8–23)
CO2: 24 mmol/L (ref 22–32)
Calcium: 8 mg/dL — ABNORMAL LOW (ref 8.9–10.3)
Chloride: 101 mmol/L (ref 98–111)
Creatinine, Ser: 1.19 mg/dL — ABNORMAL HIGH (ref 0.44–1.00)
GFR, Estimated: 46 mL/min — ABNORMAL LOW (ref >60)
Glucose, Bld: 119 mg/dL — ABNORMAL HIGH (ref 70–99)
Potassium: 3.6 mmol/L (ref 3.5–5.1)
Sodium: 135 mmol/L (ref 135–145)
Total Bilirubin: 0.7 mg/dL (ref 0.0–1.2)
Total Protein: 4.8 g/dL — ABNORMAL LOW (ref 6.5–8.1)

## 2024-08-30 LAB — CBC WITH DIFFERENTIAL/PLATELET
Abs Immature Granulocytes: 0.04 K/uL (ref 0.00–0.07)
Basophils Absolute: 0 K/uL (ref 0.0–0.1)
Basophils Relative: 0 %
Eosinophils Absolute: 0 K/uL (ref 0.0–0.5)
Eosinophils Relative: 0 %
HCT: 16.7 % — ABNORMAL LOW (ref 36.0–46.0)
Hemoglobin: 5.6 g/dL — CL (ref 12.0–15.0)
Immature Granulocytes: 1 %
Lymphocytes Relative: 14 %
Lymphs Abs: 1.1 K/uL (ref 0.7–4.0)
MCH: 32 pg (ref 26.0–34.0)
MCHC: 33.5 g/dL (ref 30.0–36.0)
MCV: 95.4 fL (ref 80.0–100.0)
Monocytes Absolute: 1 K/uL (ref 0.1–1.0)
Monocytes Relative: 12 %
Neutro Abs: 6.1 K/uL (ref 1.7–7.7)
Neutrophils Relative %: 73 %
Platelets: 125 K/uL — ABNORMAL LOW (ref 150–400)
RBC: 1.75 MIL/uL — ABNORMAL LOW (ref 3.87–5.11)
RDW: 13.4 % (ref 11.5–15.5)
WBC: 8.3 K/uL (ref 4.0–10.5)
nRBC: 0 % (ref 0.0–0.2)

## 2024-08-30 LAB — ABO/RH: ABO/RH(D): O POS

## 2024-08-30 LAB — MAGNESIUM: Magnesium: 1.9 mg/dL (ref 1.7–2.4)

## 2024-08-30 LAB — PREPARE RBC (CROSSMATCH)

## 2024-08-30 LAB — PHOSPHORUS: Phosphorus: 3.3 mg/dL (ref 2.5–4.6)

## 2024-08-30 MED ORDER — LORAZEPAM 2 MG/ML IJ SOLN
1.0000 mg | Freq: Once | INTRAMUSCULAR | Status: AC
  Administered 2024-08-30: 1 mg via INTRAVENOUS
  Filled 2024-08-30: qty 1

## 2024-08-30 MED ORDER — SODIUM CHLORIDE 0.9% IV SOLUTION: Freq: Once | INTRAVENOUS | Status: AC

## 2024-08-30 MED ORDER — DONEPEZIL HCL 5 MG PO TABS
2.5000 mg | ORAL_TABLET | ORAL | Status: DC
  Administered 2024-08-31: 2.5 mg via ORAL
  Filled 2024-08-30: qty 1

## 2024-08-30 MED ORDER — SENNOSIDES-DOCUSATE SODIUM 8.6-50 MG PO TABS
1.0000 | ORAL_TABLET | Freq: Two times a day (BID) | ORAL | Status: DC
  Administered 2024-08-30 – 2024-09-02 (×6): 1 via ORAL
  Filled 2024-08-30 (×6): qty 1

## 2024-08-30 MED ORDER — POLYETHYLENE GLYCOL 3350 17 G PO PACK
17.0000 g | PACK | Freq: Two times a day (BID) | ORAL | Status: DC
  Administered 2024-09-01 – 2024-09-02 (×2): 17 g via ORAL
  Filled 2024-08-30 (×5): qty 1

## 2024-08-30 MED ORDER — HALOPERIDOL LACTATE 5 MG/ML IJ SOLN
2.0000 mg | INTRAMUSCULAR | Status: DC | PRN
  Administered 2024-08-30 (×3): 2 mg via INTRAVENOUS
  Filled 2024-08-30 (×3): qty 1

## 2024-08-30 MED ORDER — FUROSEMIDE 10 MG/ML IJ SOLN
20.0000 mg | Freq: Once | INTRAMUSCULAR | Status: AC
  Administered 2024-08-30: 20 mg via INTRAVENOUS
  Filled 2024-08-30: qty 2

## 2024-08-30 NOTE — Progress Notes (Signed)
 PROGRESS NOTE    Cheryl Olson  FMW:[id number] DOB: 11/15/1940 DOA: 08/28/2024 PCP: Larnell Hamilton, MD   Brief Narrative:  Cheryl Olson is a 83 y.o. female with a history of dementia and COPD.  Patient presented secondary to being found down after an unwitnessed fall and found to have a right femur fracture. Syncope workup started and orthopedic surgery consulted for management of fracture.  She underwent IM nailing of the right distal femur fracture which was retrograde and she is postoperative day 1.  Subsequently she is found to have acute blood loss anemia and she is being typed and screened and transfused 2 units of PRBCs.  Assessment and Plan:  Possible Syncope: Unwitnessed fall. Complicated by underlying dementia as patient cannot give a history, so syncope could not be ruled out. Transthoracic Echocardiogram ordered and is with LVEF >70% and no aortic stenosis. EEG ordered and declined per family. Head CT done and showed No acute intracranial abnormality. Mild to moderate chronic small vessel ischemic disease. Cervical CT spine w/o Contrast showed no acute cervical spine fracture or traumatic malalignment and Advanced disc and facet degeneration.. -UDS ordered but not completed. Alcohol level undetectable. Blood Cx x2 @ 2 Days. Orthostatics deferred secondary to leg fracture. Per family, patient has a history of vasovagal syncope, which could be the etiology. Continue Telemetry Monitoring. PT/OT eval   Right Femur Fracture: Secondary to unwitnessed fall. Orthopedic surgery consulted with plan for surgical management 11/8. Underwent IM nailing of the R Distal Femur Fx. Ortho recommending TDWB on the Right Leg. C/w Enoxaparin  40 mg Daily for DVT Ppx. C/w Pain Control with IV Morphine 1 mg q4hprn.  Continue supportive care with ondansetron  4 mg p.o./IV every 6 as needed for nausea.  Will add a bowel regimen with senna docusate 1 tab p.o. twice daily (discontinuing the Docusate 100 mg  po BID) and MiraLAX 17 g p.o. twice daily   COPD: Continue Duonebs as needed 3 mL q6hprn Wheezing. CTM Respiratory Status carefully   Dementia: Noted. Patient lives at a memory care facility. C/w Donepezil  2.5 mg po EOD, Escitalopram 15 mg po Daily, Divalproex 125 mg po Daily, Memantine  10 mg po BID. Delirium Precautions and continue trazodone 50 mg p.o. nightly as needed sleep, mood/anxiety disorder; continue with 2 mg of IV Haldol every 4 as needed agitation but given a one-time dose of lorazepam  1 mg  Abnormal LFTs:  Recent Labs  Lab 08/28/24 0831 08/30/24 0944  AST 37 80*  ALT 15 62*  -CTM and Trend Hepatic Fxn Panel;   ABLA superimposed on Chronic Normocytic Anemia:  Recent Labs  Lab 08/28/24 0831 08/28/24 0842 08/28/24 1440 08/29/24 1404 08/30/24 0944  HGB 11.7* 12.6 10.2* 7.7* 5.6*  HCT 35.8* 37.0 31.8* 24.3* 16.7*  MCV 97.5  --  98.1 99.6 95.4  -Type and Screen and Transfuse 2 units of pRBCs.   Thrombocytopenia: In the setting of Acute Blood loss from above. Plt Count Trend:  Recent Labs  Lab 08/28/24 0831 08/28/24 1440 08/29/24 1404 08/30/24 0944  PLT 182 162 164 125*   Hypoalbuminemia: Patient's Albumin Lvl went from 3.7 -> 2.8. CTM & Trend & repeat CMP in the AM   DVT prophylaxis: SCDs Start: 08/29/24 1333    Code Status: Full Code Family Communication: Discussed with son at bedside  Disposition Plan:  Level of care: Telemetry Status is: Inpatient Remains inpatient appropriate because: His further clinical improvement and clearance of the specialists   Consultants:  Orthopedic surgery  Procedures:  As delineated as above; intramedullary nailing of a right distal femur fracture which was retrograde done by Dr. Lonni Pae on 08/29/2024  Antimicrobials:  Anti-infectives (From admission, onward)    Start     Dose/Rate Route Frequency Ordered Stop   08/29/24 1600  ceFAZolin (ANCEF) IVPB 2g/100 mL premix        2 g 200 mL/hr over 30 Minutes  Intravenous Every 6 hours 08/29/24 1333 08/30/24 1329   08/29/24 0845  ceFAZolin (ANCEF) IVPB 2g/100 mL premix        2 g 200 mL/hr over 30 Minutes Intravenous On call to O.R. 08/29/24 0839 08/29/24 1020       Subjective: Seen and examined at bedside and she is pleasantly confused and demented.  Not really complaining of anything.  No nausea or vomiting.  Blood count had dropped so we will transfuse 2 units of PRBCs.  Son is in agreement with this.  No other concerns or complaints at this time but later in the afternoon she started having worsening confusion and agitation.  Objective: Vitals:   08/30/24 1510 08/30/24 1531 08/30/24 1636 08/30/24 1653  BP: (!) 161/133 (!) 149/54 (!) 140/58 (!) 136/54  Pulse: 81 79 86 83  Resp: 16  15 16   Temp: 97.6 F (36.4 C) 98.2 F (36.8 C) 98.1 F (36.7 C) 98.5 F (36.9 C)  TempSrc:  Oral Oral   SpO2: 91%     Weight:      Height:        Intake/Output Summary (Last 24 hours) at 08/30/2024 1727 Last data filed at 08/30/2024 1515 Gross per 24 hour  Intake 415.06 ml  Output --  Net 415.06 ml   Filed Weights   08/28/24 0814  Weight: 59 kg   Examination: Physical Exam:  Constitutional: Thin chronically ill-appearing elderly Caucasian female in no acute distress Respiratory: Diminished to auscultation bilaterally, no wheezing, rales, rhonchi or crackles. Normal respiratory effort and patient is not tachypenic. No accessory muscle use.  Unlabored breathing Cardiovascular: RRR, no murmurs / rubs / gallops. S1 and S2 auscultated. No extremity edema.  Abdomen: Soft, non-tender, non-distended. Bowel sounds positive.  GU: Deferred. Musculoskeletal: No clubbing / cyanosis of digits/nails. No joint deformity upper and lower extremities.  Skin: No rashes, lesions, ulcers on limited skin evaluation. No induration; Warm and dry.  Neurologic: Little drowsy and fairly confused Psychiatric: Impaired judgment insight and she is demented and not fully alert  and oriented  Data Reviewed: I have personally reviewed following labs and imaging studies  CBC: Recent Labs  Lab 08/28/24 0831 08/28/24 0842 08/28/24 1440 08/29/24 1404 08/30/24 0944  WBC 8.1  --  6.6 9.4 8.3  NEUTROABS 6.6  --   --   --  6.1  HGB 11.7* 12.6 10.2* 7.7* 5.6*  HCT 35.8* 37.0 31.8* 24.3* 16.7*  MCV 97.5  --  98.1 99.6 95.4  PLT 182  --  162 164 125*   Basic Metabolic Panel: Recent Labs  Lab 08/28/24 0831 08/28/24 0842 08/28/24 1440 08/29/24 1404 08/30/24 0944  NA 138 138  --   --  135  K 3.6 3.5  --   --  3.6  CL 101 98  --   --  101  CO2 25  --   --   --  24  GLUCOSE 101* 99  --   --  119*  BUN 13 17  --   --  28*  CREATININE 1.09* 1.00 0.98 1.02* 1.19*  CALCIUM 8.9  --   --   --  8.0*  MG  --   --   --   --  1.9  PHOS  --   --   --   --  3.3   GFR: Estimated Creatinine Clearance: 30.1 mL/min (A) (by C-G formula based on SCr of 1.19 mg/dL (H)). Liver Function Tests: Recent Labs  Lab 08/28/24 0831 08/30/24 0944  AST 37 80*  ALT 15 62*  ALKPHOS 59 62  BILITOT 0.9 0.7  PROT 6.2* 4.8*  ALBUMIN 3.7 2.8*   No results for input(s): LIPASE, AMYLASE in the last 168 hours. No results for input(s): AMMONIA in the last 168 hours. Coagulation Profile: No results for input(s): INR, PROTIME in the last 168 hours. Cardiac Enzymes: No results for input(s): CKTOTAL, CKMB, CKMBINDEX, TROPONINI in the last 168 hours. BNP (last 3 results) No results for input(s): PROBNP in the last 8760 hours. HbA1C: No results for input(s): HGBA1C in the last 72 hours. CBG: No results for input(s): GLUCAP in the last 168 hours. Lipid Profile: No results for input(s): CHOL, HDL, LDLCALC, TRIG, CHOLHDL, LDLDIRECT in the last 72 hours. Thyroid Function Tests: No results for input(s): TSH, T4TOTAL, FREET4, T3FREE, THYROIDAB in the last 72 hours. Anemia Panel: No results for input(s): VITAMINB12, FOLATE, FERRITIN, TIBC,  IRON, RETICCTPCT in the last 72 hours. Sepsis Labs: No results for input(s): PROCALCITON, LATICACIDVEN in the last 168 hours.  Recent Results (from the past 240 hours)  Culture, blood (Routine X 2) w Reflex to ID Panel     Status: None (Preliminary result)   Collection Time: 08/28/24  7:04 PM   Specimen: BLOOD LEFT ARM  Result Value Ref Range Status   Specimen Description BLOOD LEFT ARM  Final   Special Requests   Final    BOTTLES DRAWN AEROBIC AND ANAEROBIC Blood Culture results may not be optimal due to an inadequate volume of blood received in culture bottles   Culture   Final    NO GROWTH 2 DAYS Performed at [facility], [address], [place], [place] [address]    Report Status PENDING  Incomplete  Culture, blood (Routine X 2) w Reflex to ID Panel     Status: None (Preliminary result)   Collection Time: 08/28/24  7:09 PM   Specimen: BLOOD LEFT HAND  Result Value Ref Range Status   Specimen Description BLOOD LEFT HAND  Final   Special Requests   Final    BOTTLES DRAWN AEROBIC AND ANAEROBIC Blood Culture adequate volume   Culture  Setup Time NO ORGANISMS SEEN ANAEROBIC BOTTLE ONLY   Final   Culture   Final    NO GROWTH 2 DAYS Performed at [facility], [address], [place], [place] [address]    Report Status PENDING  Incomplete  Surgical pcr screen     Status: None   Collection Time: 08/29/24 12:40 AM   Specimen: Nasal Mucosa; Nasal Swab  Result Value Ref Range Status   MRSA, PCR NEGATIVE NEGATIVE Final   Staphylococcus aureus NEGATIVE NEGATIVE Final    Comment: (NOTE) The Xpert SA Assay (FDA approved for NASAL specimens in patients 9 years of age and older), is one component of a comprehensive surveillance program. It is not intended to diagnose infection nor to guide or monitor treatment. Performed at [facility], [address], [place], [place] [address]     Radiology Studies: DG FEMUR, MIN 2 VIEWS RIGHT Result  Date:  08/29/2024 CLINICAL DATA:  Elective surgery. EXAM: RIGHT FEMUR 2 VIEWS COMPARISON:  Radiographs yesterday FINDINGS: Eleven fluoroscopic spot views of the right femur submitted from the operating room. Femoral intramedullary nail with proximal and distal locking screw fixation traverse distal femur fracture. Improved fracture alignment from preoperative imaging. Fluoroscopy time 2 minutes 2 seconds. Dose 4.3 mGy. IMPRESSION: Intraoperative fluoroscopy during ORIF of distal femur fracture. Electronically Signed   By: Andrea Gasman M.D.   On: 08/29/2024 12:42   DG C-Arm 1-60 Min-No Report Result Date: 08/29/2024 Fluoroscopy was utilized by the requesting physician.  No radiographic interpretation.   DG C-Arm 1-60 Min-No Report Result Date: 08/29/2024 Fluoroscopy was utilized by the requesting physician.  No radiographic interpretation.   Scheduled Meds:  Chlorhexidine Gluconate Cloth  6 each Topical Daily   divalproex  125 mg Oral QPM   donepezil   2.5 mg Oral QODAY   escitalopram  15 mg Oral Daily   memantine   10 mg Oral BID   polyethylene glycol  17 g Oral BID   senna-docusate  1 tablet Oral BID   Continuous Infusions:   LOS: 2 days   Alejandro Marker, DO [facility] Available via Epic secure chat 7am-7pm After these hours, please refer to coverage provider listed on [email or web address] 08/30/2024, 5:27 PM

## 2024-08-30 NOTE — Progress Notes (Addendum)
 Unable to reach POA for transfusion consent. Due to critical  low Hemoglobin, next contact person  Teyla Skidgel -son was contacted and telephone consent was obtained and witness by me Charge nurse.

## 2024-08-30 NOTE — Evaluation (Signed)
 Physical Therapy Evaluation Patient Details Name: Cheryl Olson MRN: [id number] DOB: Mar 20, 1941 Today's Date: 08/30/2024  History of Present Illness  Pt is a 83 y.o. female admitted 08/28/24 after being found down after an unwitnessed fall sustaining displaced and comminuted right distal femur fx. Pt s/p IM nail 11/8. PMHx: dementia, COPD, and asthma. Family reports hx of vasovagal syncope.   Clinical Impression  Pt admitted with above diagnosis. PTA, pt was ambulating without an AD, although family had recently purchased a RW. She resides at [facility] in a memory care unit. Pt currently with functional limitations due to the deficits listed below (see PT Problem List). She required totalA x2 for bed mobility. Attempted sit<>stand twice with 2 HHA and was unable to achieve upright posture despite totalA x2. Pt is currently limited by impaired cognition secondary to dementia, pain, weight bearing status (RLE ideally TDWB okay for WBAT during transfers), impaired balance, and poor activity tolerance. Pt will benefit from acute skilled PT to maximize her independence and safety with mobility to allow discharge. Recommend continued inpatient follow up therapy, <3 hours/day.    If plan is discharge home, recommend the following: Two people to help with walking and/or transfers;Two people to help with bathing/dressing/bathroom;Assistance with cooking/housework;Assist for transportation;Help with stairs or ramp for entrance;Supervision due to cognitive status;Direct supervision/assist for medications management;Direct supervision/assist for financial management   Can travel by private vehicle   No    Equipment Recommendations Wheelchair (measurements PT);Wheelchair cushion (measurements PT);BSC/3in1;Rolling walker (2 wheels)  Recommendations for Other Services       Functional Status Assessment Patient has had a recent decline in their functional status and/or demonstrates limited ability to make  significant improvements in function in a reasonable and predictable amount of time     Precautions / Restrictions Precautions Precautions: Fall Recall of Precautions/Restrictions: Impaired Restrictions Weight Bearing Restrictions Per Provider Order: Yes RLE Weight Bearing Per Provider Order: Touchdown weight bearing Other Position/Activity Restrictions: OK'ed for WBAT for standing and pivots by Dr. Elsa 08/30/24      Mobility  Bed Mobility Overal bed mobility: Needs Assistance Bed Mobility: Rolling, Supine to Sit, Sit to Supine Rolling: Total assist, +2 for physical assistance, +2 for safety/equipment, Used rails   Supine to sit: Total assist, +2 for physical assistance, +2 for safety/equipment Sit to supine: Total assist, +2 for physical assistance, +2 for safety/equipment   General bed mobility comments: Pt did not respond to cues and ultimately required total A +2 for all aspects of bed mobility with heavy assist from bed pad for hips to come EOB. She initially has very strong posterior/left lateral lean. Repositioned using bed pad and bed features.    Transfers Overall transfer level: Needs assistance Equipment used: 2 person hand held assist Transfers: Sit to/from Stand Sit to Stand: Total assist, +2 physical assistance, +2 safety/equipment, From elevated surface           General transfer comment: Attempt to stand twice from elevated bed with use of bed pad to facilitate hip extension. Pt unable to clear hips and bring chest upright despite totalA x2 and c/o worsening pain.    Ambulation/Gait               General Gait Details: Unable  Stairs            Wheelchair Mobility     Tilt Bed    Modified Rankin (Stroke Patients Only)       Balance Overall balance assessment: Needs assistance Sitting-balance support:  Bilateral upper extremity supported, Feet supported (Pt reaches out for therapy assist with seated balance) Sitting balance-Leahy  Scale: Poor Sitting balance - Comments: brief moments of CGA, mostly requiring mod to max A left lateral lean and posterior lean (trying to offweight RLE/hip) Postural control: Left lateral lean, Posterior lean Standing balance support: Bilateral upper extremity supported Standing balance-Leahy Scale: Zero Standing balance comment: 100% dependent on therapists, unable to achieve full upright                             Pertinent Vitals/Pain Pain Assessment Pain Assessment: PAINAD Breathing: occasional labored breathing, short period of hyperventilation Negative Vocalization: occasional moan/groan, low speech, negative/disapproving quality Facial Expression: sad, frightened, frown Body Language: rigid, fists clenched, knees up, pushing/pulling away, strikes out Consolability: distracted or reassured by voice/touch PAINAD Score: 6 Pain Intervention(s): Premedicated before session, Monitored during session, Repositioned    Home Living Family/patient expects to be discharged to:: Assisted living                 Home Equipment: Agricultural Consultant (2 wheels);Shower seat - built in;Grab bars - tub/shower;Hand held shower head Additional Comments: Pt is from Memory Care at [facility]    Prior Function Prior Level of Function : Needs assist;History of Falls (last six months)  Cognitive Assist : Mobility (cognitive);ADLs (cognitive) Mobility (Cognitive): Step by step cues ADLs (Cognitive): Step by step cues Physical Assist : ADLs (physical)   ADLs (physical): Bathing;Dressing;IADLs Mobility Comments: ambulates without DME, a few falls, family had recently purchsed her a RW ADLs Comments: assist from staff with self-care, meals, and medications.     Extremity/Trunk Assessment   Upper Extremity Assessment Upper Extremity Assessment: Defer to OT evaluation    Lower Extremity Assessment Lower Extremity Assessment: Difficult to assess due to impaired cognition;RLE  deficits/detail RLE Deficits / Details: Pt POD 1 s/p femur IM nail. Decreased hip and knee AROM. Deferred PROM attempts d/t rest. Minimal to no active motion noted during session. RLE: Unable to fully assess due to pain RLE Sensation: decreased proprioception RLE Coordination: decreased gross motor    Cervical / Trunk Assessment Cervical / Trunk Assessment: Kyphotic  Communication   Communication Communication: Impaired Factors Affecting Communication: Difficulty expressing self    Cognition Arousal: Lethargic Behavior During Therapy: Restless   PT - Cognitive impairments: History of cognitive impairments (Dementia)                       PT - Cognition Comments: Pt with eyes closed through majority of session. She occasionally would briefly open her eyes. No command following. Only verbalizations were help me and ow. Following commands: Impaired Following commands impaired: Follows one step commands inconsistently     Cueing Cueing Techniques: Verbal cues, Tactile cues     General Comments General comments (skin integrity, edema, etc.): Pt's son Ray came in halfway through session and was supportive offering encouragement.    Exercises     Assessment/Plan    PT Assessment Patient needs continued PT services  PT Problem List Decreased strength;Decreased range of motion;Decreased activity tolerance;Decreased balance;Decreased mobility;Decreased coordination;Decreased cognition;Decreased knowledge of use of DME;Decreased safety awareness;Decreased knowledge of precautions;Pain       PT Treatment Interventions DME instruction;Gait training;Functional mobility training;Therapeutic activities;Therapeutic exercise;Balance training;Patient/family education;Cognitive remediation;Wheelchair mobility training    PT Goals (Current goals can be found in the Care Plan section)  Acute Rehab PT Goals Patient Stated Goal: Have pt  get back to walking PT Goal Formulation: With  family Time For Goal Achievement: 09/13/24 Potential to Achieve Goals: Fair    Frequency Min 2X/week     Co-evaluation PT/OT/SLP Co-Evaluation/Treatment: Yes Reason for Co-Treatment: Necessary to address cognition/behavior during functional activity;For patient/therapist safety;To address functional/ADL transfers PT goals addressed during session: Mobility/safety with mobility;Balance OT goals addressed during session: ADL's and self-care;Strengthening/ROM       AM-PAC PT 6 Clicks Mobility  Outcome Measure Help needed turning from your back to your side while in a flat bed without using bedrails?: Total Help needed moving from lying on your back to sitting on the side of a flat bed without using bedrails?: Total Help needed moving to and from a bed to a chair (including a wheelchair)?: Total Help needed standing up from a chair using your arms (e.g., wheelchair or bedside chair)?: Total Help needed to walk in hospital room?: Total Help needed climbing 3-5 steps with a railing? : Total 6 Click Score: 6    End of Session Equipment Utilized During Treatment: Gait belt Activity Tolerance: Patient limited by lethargy;Patient limited by pain Patient left: in bed;with call bell/phone within reach;with bed alarm set;with family/visitor present Nurse Communication: Mobility status;Need for lift equipment PT Visit Diagnosis: Difficulty in walking, not elsewhere classified (R26.2);Other abnormalities of gait and mobility (R26.89);Unsteadiness on feet (R26.81);Muscle weakness (generalized) (M62.81);Pain Pain - Right/Left: Right Pain - part of body: Hip    Time: 9160-9092 PT Time Calculation (min) (ACUTE ONLY): 28 min   Charges:   PT Evaluation $PT Eval Moderate Complexity: 1 Mod   PT General Charges $$ ACUTE PT VISIT: 1 Visit         Randall SAUNDERS, PT, DPT Acute Rehabilitation Services Office: [phone] Secure Chat Preferred  Delon CHRISTELLA Callander 08/30/2024, 12:34 PM

## 2024-08-30 NOTE — Plan of Care (Signed)

## 2024-08-30 NOTE — Evaluation (Signed)
 Occupational Therapy Evaluation Patient Details Name: Cheryl Olson MRN: [id number] DOB: [birth year] Today's Date: 08/30/2024   History of Present Illness   Pt is a 83 y.o. female admitted 08/28/24 after being found down after an unwitnessed fall sustaining displaced and comminuted right distal femur fx. Pt s/p IM nail 11/8. PMHx: dementia, COPD, and asthma. Family reports hx of vasovagal syncope.     Clinical Impressions Pt is from memory care unit at [facility]. She typically walks without DME - but family had recently purchased a RW for her. She gets IADL done for her, and gets assist for bathing/dressing. 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 Today for therapy she would not keep her eyes open (briefly open and then closes them) despite positional changes. She would follow some commands, but ultimately is max A to total A +2 for all aspects of bed mobility and ADL. While seated EOB she did progress to GCA for seated balance briefly but mostly required mod A for seated balance. Pt required hand over hand assist for grooming tasks. L lateral and posterior lean in sitting (assume to offweight RLE) Attempted standing x2 and was unable to come to full standing. OT will follow acutely, and at this time recommending post-acute rehab of <3 hours daily to maximize safety and independence in ADL and functional transfers.      If plan is discharge home, recommend the following:   Two people to help with walking and/or transfers;A lot of help with bathing/dressing/bathroom;Assistance with feeding;Direct supervision/assist for medications management     Functional Status Assessment   Patient has had a recent decline in their functional status and demonstrates the ability to make significant improvements in function in a  reasonable and predictable amount of time.     Equipment Recommendations   Wheelchair (measurements OT);Wheelchair cushion (measurements OT)     Recommendations for Other Services   PT consult;Speech consult     Precautions/Restrictions   Precautions Precautions: Fall Recall of Precautions/Restrictions: Impaired Precaution/Restrictions Comments: dementia Restrictions Weight Bearing Restrictions Per Provider Order: Yes RLE Weight Bearing Per Provider Order: Touchdown weight bearing Other Position/Activity Restrictions: OK'ed for WBAT for standing and pivots by Dr Elsa 08/30/24     Mobility Bed Mobility Overal bed mobility: Needs Assistance Bed Mobility: Rolling, Supine to Sit, Sit to Supine Rolling: Total assist, +2 for physical assistance, +2 for safety/equipment, Used rails (heavy use of pad to assist)   Supine to sit: Total assist, +2 for physical assistance, +2 for safety/equipment Sit to supine: Total assist, +2 for physical assistance, +2 for safety/equipment   General bed mobility comments: Pt did not respond to cues and ultimately required total A +2 for all aspects of bed mobility with heavy assist from bed pad for hips to come EOB. Pt keeps eyes closed throughout and initially has very strong posterior/left lateral lean    Transfers Overall transfer level: Needs assistance Equipment used: 2 person hand held assist Transfers: Sit to/from Stand Sit to Stand: Total assist, +2 physical assistance, +2 safety/equipment, From elevated surface           General transfer comment: attempted x2 incomplete stands from elevated EOB      Balance Overall balance assessment: Needs assistance Sitting-balance support: Bilateral upper extremity supported, Feet supported (Pt reaches out for therapy assist with seated balance) Sitting  balance-Leahy Scale: Poor Sitting balance - Comments: brief moments of CGA, mostly requiring mod to max A left lateral lean and posterior  lean (trying to offweight RLE/hip) Postural control: Left lateral lean, Posterior lean Standing balance support: Bilateral upper extremity supported Standing balance-Leahy Scale: Zero Standing balance comment: 100% dependent on therapists, unable to achieve full upright                           ADL either performed or assessed with clinical judgement   ADL Overall ADL's : Needs assistance/impaired Eating/Feeding: Maximal assistance   Grooming: Wash/dry face;Maximal assistance;Sitting Grooming Details (indicate cue type and reason): EOB, hand over hand Upper Body Bathing: Maximal assistance;Sitting Upper Body Bathing Details (indicate cue type and reason): front and back of body Lower Body Bathing: Maximal assistance;Bed level   Upper Body Dressing : Maximal assistance;Bed level Upper Body Dressing Details (indicate cue type and reason): new gown Lower Body Dressing: Total assistance;Bed level Lower Body Dressing Details (indicate cue type and reason): RLE very painful Toilet Transfer: Total assistance;+2 for physical assistance;+2 for safety/equipment Toilet Transfer Details (indicate cue type and reason): simulated through attempted sit<>Stand from EOB Toileting- Clothing Manipulation and Hygiene: Total assistance;Bed level       Functional mobility during ADLs: Total assistance;+2 for safety/equipment;+2 for physical assistance General ADL Comments: overall max A +2 total A for all aspects of ADL currently     Vision Baseline Vision/History: 1 Wears glasses Patient Visual Report: No change from baseline       Perception         Praxis         Pertinent Vitals/Pain Pain Assessment Pain Assessment: PAINAD Breathing: occasional labored breathing, short period of hyperventilation Negative Vocalization: occasional moan/groan, low speech, negative/disapproving quality Facial Expression: sad, frightened, frown Body Language: rigid, fists clenched, knees up,  pushing/pulling away, strikes out Consolability: distracted or reassured by voice/touch PAINAD Score: 6 Pain Intervention(s): Monitored during session, Premedicated before session, Repositioned     Extremity/Trunk Assessment Upper Extremity Assessment Upper Extremity Assessment: Generalized weakness   Lower Extremity Assessment Lower Extremity Assessment: RLE deficits/detail;Defer to PT evaluation RLE Coordination: decreased fine motor;decreased gross motor       Communication Communication Communication: Impaired Factors Affecting Communication: Difficulty expressing self (dementia)   Cognition Arousal: Lethargic Behavior During Therapy: Restless Cognition: History of cognitive impairments (from memory care at [facility])                               Following commands: Impaired Following commands impaired: Follows one step commands inconsistently     Cueing  General Comments   Cueing Techniques: Verbal cues;Tactile cues  Pt's son [name] came in halfway through session and provided PLOF and therapy discussed plans for post-acute rehab with him.   Exercises     Shoulder Instructions      Home Living Family/patient expects to be discharged to:: Assisted living                             Home Equipment: Rolling Walker (2 wheels);Shower seat - built in;Grab bars - tub/shower;Hand held shower head   Additional Comments: Pt from memory care at [facility] where she walked  without DME (although family just got a RW) meals at facility and      Prior Functioning/Environment Prior Level of Function : Needs assist;History  of Falls (last six months)             Mobility Comments: ambulates without DME, a few falls, family had recently purchsed her a RW ADLs Comments: assist from staff for bathing/dressing    OT Problem List: Decreased activity tolerance;Decreased strength;Decreased range of motion;Impaired balance (sitting and/or standing);Decreased  cognition;Decreased safety awareness;Decreased knowledge of use of DME or AE;Pain   OT Treatment/Interventions: Self-care/ADL training;DME and/or AE instruction;Therapeutic activities;Patient/family education;Balance training      OT Goals(Current goals can be found in the care plan section)   Acute Rehab OT Goals Patient Stated Goal: none OT Goal Formulation: Patient unable to participate in goal setting Time For Goal Achievement: 09/13/24 Potential to Achieve Goals: Fair ADL Goals Pt Will Perform Grooming: with supervision;sitting Pt Will Perform Upper Body Bathing: with min assist;sitting Pt Will Perform Upper Body Dressing: with min assist;sitting Pt Will Transfer to Toilet: with max assist;stand pivot transfer Pt Will Perform Toileting - Clothing Manipulation and hygiene: with max assist;sit to/from stand Additional ADL Goal #1: Pt will maintain unsupported sitting EOB at supervision level for at least 3 minutes during ADL participation   OT Frequency:  Min 2X/week    Co-evaluation PT/OT/SLP Co-Evaluation/Treatment: Yes Reason for Co-Treatment: Necessary to address cognition/behavior during functional activity PT goals addressed during session: Mobility/safety with mobility;Balance;Strengthening/ROM OT goals addressed during session: ADL's and self-care;Strengthening/ROM      AM-PAC OT 6 Clicks Daily Activity     Outcome Measure Help from another person eating meals?: A Lot Help from another person taking care of personal grooming?: A Lot Help from another person toileting, which includes using toliet, bedpan, or urinal?: Total Help from another person bathing (including washing, rinsing, drying)?: Total Help from another person to put on and taking off regular upper body clothing?: Total Help from another person to put on and taking off regular lower body clothing?: Total 6 Click Score: 8   End of Session Equipment Utilized During Treatment: Gait belt Nurse  Communication: Mobility status;Precautions  Activity Tolerance: Patient tolerated treatment well;Patient limited by pain (limited by cognition) Patient left: in bed;with call bell/phone within reach;with family/visitor present;with restraints reapplied;with nursing/sitter in room (mitts only, NT in room)  OT Visit Diagnosis: Other abnormalities of gait and mobility (R26.89);Muscle weakness (generalized) (M62.81);History of falling (Z91.81);Other symptoms and signs involving cognitive function;Pain Pain - Right/Left: Right Pain - part of body: Leg                Time: 9159-9075 OT Time Calculation (min): 44 min Charges:  OT General Charges $OT Visit: 1 Visit OT Evaluation $OT Eval Moderate Complexity: 1 Mod OT Treatments $Self Care/Home Management : 8-22 mins  Leita DEL OTR/L Acute Rehabilitation Services Office: [phone]  Leita PARAS [facility] 08/30/2024, 10:32 AM

## 2024-08-30 NOTE — Progress Notes (Signed)
 Patient very agitated at this time. Attempts to pull mittens off. Rubbing on PIVs which are newly placed. Family stated patient does better on Ativan . MD made aware. Will add Ativan .

## 2024-08-30 NOTE — Progress Notes (Signed)
.  Subjective: 1 Day Post-Op Procedure(s) (LRB): INSERTION, INTRAMEDULLARY ROD, FEMUR, RETROGRADE (Right)  Patient seen and examined. Worked with PT this morning. Sleeping at time of exam  Activity level:  Touchdown weight bearing right leg   Objective: Vital signs in last 24 hours: Temp:  [97.5 F (36.4 C)-98.4 F (36.9 C)] 97.8 F (36.6 C) (11/09 0419) Pulse Rate:  [59-92] 85 (11/09 0419) Resp:  [9-16] 16 (11/09 0419) BP: (83-144)/(48-113) 144/113 (11/09 0419) SpO2:  [96 %-100 %] 98 % (11/09 0419)  Labs: Recent Labs    08/28/24 0831 08/28/24 0842 08/28/24 1440 08/29/24 1404 08/30/24 0944  HGB 11.7* 12.6 10.2* 7.7* 5.6*   Recent Labs    08/29/24 1404 08/30/24 0944  WBC 9.4 8.3  RBC 2.44* 1.75*  HCT 24.3* 16.7*  PLT 164 125*   Recent Labs    08/28/24 0831 08/28/24 0842 08/28/24 1440 08/29/24 1404  NA 138 138  --   --   K 3.6 3.5  --   --   CL 101 98  --   --   CO2 25  --   --   --   BUN 13 17  --   --   CREATININE 1.09* 1.00 0.98 1.02*  GLUCOSE 101* 99  --   --   CALCIUM 8.9  --   --   --    No results for input(s): LABPT, INR in the last 72 hours.  Physical Exam:  Neurologically intact ABD soft Neurovascular intact Sensation intact distally Intact pulses distally Dorsiflexion/Plantar flexion intact Incision: dressing C/D/I  Assessment/Plan:  1 Day Post-Op Procedure(s) (LRB): INSERTION, INTRAMEDULLARY ROD, FEMUR, RETROGRADE (Right)  Touchdown weight bearing right leg PT/OT Lovenox  40mg  daily for DVT ppx Diet as tolerated Pain control as needed Hgb 5.6. Likely acute blood loss anemia from surgery and fracture. Transfusion pending. Continue to trend CBC and transfuse as needed   Adine JAYSON Mon 08/30/2024, 10:28 AM

## 2024-08-30 NOTE — Progress Notes (Addendum)
 Message left for POA, [name], daughter, to call me directly for consent for a blood transfusion for pt.  Hgb 5.6.  _________   Verbal consent given by Quintin Nobles, son.  2nd witness, Dorothy, CHARITY FUNDRAISER, Press Photographer.

## 2024-08-30 NOTE — Progress Notes (Signed)
   08/30/24 1013  Provider Notification  Provider Name/Title Dr Sherrill  Date Provider Notified 08/30/24  Time Provider Notified 1014  Method of Notification Rounds  Notification Reason Critical Result  Test performed and critical result Hgb 5.6  Date Critical Result Received 08/30/24  Time Critical Result Received 1000  Provider response In department  Date of Provider Response 08/30/24  Time of Provider Response 1014

## 2024-08-31 DIAGNOSIS — R7989 Other specified abnormal findings of blood chemistry: Secondary | ICD-10-CM | POA: Diagnosis not present

## 2024-08-31 DIAGNOSIS — S7291XA Unspecified fracture of right femur, initial encounter for closed fracture: Secondary | ICD-10-CM | POA: Diagnosis not present

## 2024-08-31 LAB — BPAM RBC
Blood Product Expiration Date: 202512072359
Blood Product Expiration Date: 202512072359
ISSUE DATE / TIME: 202511091206
ISSUE DATE / TIME: 202511091629
Unit Type and Rh: 5100
Unit Type and Rh: 5100

## 2024-08-31 LAB — TYPE AND SCREEN
ABO/RH(D): O POS
Antibody Screen: NEGATIVE
Unit division: 0
Unit division: 0

## 2024-08-31 LAB — COMPREHENSIVE METABOLIC PANEL WITH GFR
ALT: 28 U/L (ref 0–44)
AST: 61 U/L — ABNORMAL HIGH (ref 15–41)
Albumin: 2.7 g/dL — ABNORMAL LOW (ref 3.5–5.0)
Alkaline Phosphatase: 64 U/L (ref 38–126)
Anion gap: 10 (ref 5–15)
BUN: 24 mg/dL — ABNORMAL HIGH (ref 8–23)
CO2: 30 mmol/L (ref 22–32)
Calcium: 8.5 mg/dL — ABNORMAL LOW (ref 8.9–10.3)
Chloride: 99 mmol/L (ref 98–111)
Creatinine, Ser: 0.94 mg/dL (ref 0.44–1.00)
GFR, Estimated: >60 mL/min (ref >60)
Glucose, Bld: 108 mg/dL — ABNORMAL HIGH (ref 70–99)
Potassium: 3.9 mmol/L (ref 3.5–5.1)
Sodium: 139 mmol/L (ref 135–145)
Total Bilirubin: 1.3 mg/dL — ABNORMAL HIGH (ref 0.0–1.2)
Total Protein: 5.5 g/dL — ABNORMAL LOW (ref 6.5–8.1)

## 2024-08-31 LAB — CBC WITH DIFFERENTIAL/PLATELET
Abs Immature Granulocytes: 0.05 K/uL (ref 0.00–0.07)
Basophils Absolute: 0 K/uL (ref 0.0–0.1)
Basophils Relative: 1 %
Eosinophils Absolute: 0.1 K/uL (ref 0.0–0.5)
Eosinophils Relative: 1 %
HCT: 26.8 % — ABNORMAL LOW (ref 36.0–46.0)
Hemoglobin: 9.1 g/dL — ABNORMAL LOW (ref 12.0–15.0)
Immature Granulocytes: 1 %
Lymphocytes Relative: 11 %
Lymphs Abs: 1 K/uL (ref 0.7–4.0)
MCH: 30.7 pg (ref 26.0–34.0)
MCHC: 34 g/dL (ref 30.0–36.0)
MCV: 90.5 fL (ref 80.0–100.0)
Monocytes Absolute: 1.2 K/uL — ABNORMAL HIGH (ref 0.1–1.0)
Monocytes Relative: 14 %
Neutro Abs: 6.3 K/uL (ref 1.7–7.7)
Neutrophils Relative %: 72 %
Platelets: 138 K/uL — ABNORMAL LOW (ref 150–400)
RBC: 2.96 MIL/uL — ABNORMAL LOW (ref 3.87–5.11)
RDW: 16.5 % — ABNORMAL HIGH (ref 11.5–15.5)
WBC: 8.7 K/uL (ref 4.0–10.5)
nRBC: 0 % (ref 0.0–0.2)

## 2024-08-31 LAB — PHOSPHORUS: Phosphorus: 3 mg/dL (ref 2.5–4.6)

## 2024-08-31 LAB — MAGNESIUM: Magnesium: 1.9 mg/dL (ref 1.7–2.4)

## 2024-08-31 MED ORDER — LORAZEPAM 2 MG/ML IJ SOLN
1.0000 mg | Freq: Four times a day (QID) | INTRAMUSCULAR | Status: DC | PRN
  Administered 2024-08-31: 1 mg via INTRAVENOUS
  Filled 2024-08-31 (×2): qty 1

## 2024-08-31 NOTE — Progress Notes (Signed)
 PROGRESS NOTE    Cheryl Olson  FMW:[id number] DOB: [birth year] DOA: 08/28/2024 PCP: Larnell Hamilton, MD   Brief Narrative:  Cheryl Olson is a 83 y.o. female with a history of dementia and COPD.  Patient presented secondary to being found down after an unwitnessed fall and found to have a right femur fracture. Syncope workup started and orthopedic surgery consulted for management of fracture.  She underwent IM nailing of the right distal femur fracture which was retrograde and she is postoperative day 2.  Subsequently she was found to have acute blood loss anemia and she was typed and screened and transfused 2 units of PRBCs.    Hemoglobin is improved along with several other labs however she continues to have behavioral disturbances and had to have a waist restraint placed on her overnight.  Will remove and continue to monitor as PT OT are recommending SNF and she will need to be out of restraints for least 24 hours before discharging.  Assessment and Plan:  Possible Syncope: Unwitnessed fall. Complicated by underlying dementia as patient cannot give a history, so syncope could not be ruled out. Transthoracic Echocardiogram ordered and is with LVEF >70% and no aortic stenosis. EEG ordered and declined per family. Head CT done and showed No acute intracranial abnormality. Mild to moderate chronic small vessel ischemic disease. Cervical CT spine w/o Contrast showed no acute cervical spine fracture or traumatic malalignment and Advanced disc and facet degeneration.. -UDS ordered but not completed. Alcohol level undetectable. Blood Cx x2 @ 2 Days. Orthostatics deferred secondary to leg fracture. Per family, patient has a history of vasovagal syncope, which could be the etiology. Continue Telemetry Monitoring. PT/OT eval   Right Femur Fracture: Secondary to unwitnessed fall. Orthopedic surgery consulted with plan for surgical management 11/8. Underwent IM nailing of the R Distal Femur Fx. Ortho  recommending TDWB on the Right Leg. C/w Enoxaparin  40 mg Daily for DVT Ppx and then transition to 325 aspirin daily for 30 days at discharge. C/w Pain Control with IV Morphine 1 mg q4hprn.  Continue supportive care with ondansetron  4 mg p.o./IV every 6 as needed for nausea.  Will add a bowel regimen with senna docusate 1 tab p.o. twice daily (discontinuing the Docusate 100 mg po BID) and MiraLAX 17 g p.o. twice daily.  She will need to follow-up with orthopedic surgery Dr. Elsa for staple removal and repeat radiographs of the operative femur in 2 weeks   COPD: Continue Duonebs as needed 3 mL q6hprn Wheezing. CTM Respiratory Status carefully   Dementia: Noted. Patient lives at a memory care facility. C/w Donepezil  2.5 mg po EOD, Escitalopram 15 mg po Daily, Divalproex 125 mg po Daily, Memantine  10 mg po BID. Delirium Precautions and continue trazodone 50 mg p.o. nightly as needed sleep, mood/anxiety disorder; continue with 2 mg of IV Haldol every 4 as needed agitation but will now start IV Lorazepam  1 mg q6hprn Anxiety; Given her behavioral disturbances she was placed in a soft waist restraints. Will need to be out of restraints to go to SNF.  CKD Stage 3a: Baseline Cr ranging from 0.9-1.1. BUN/Cr Trend: Recent Labs  Lab 08/28/24 0831 08/28/24 0842 08/28/24 1440 08/29/24 1404 08/30/24 0944 08/31/24 0613  BUN 13 17  --   --  28* 24*  CREATININE 1.09* 1.00 0.98 1.02* 1.19* 0.94  -Avoid Nephrotoxic Medications, Contrast Dyes, Hypotension and Dehydration to Ensure Adequate Renal Perfusion and will need to Renally Adjust Meds; CTM & Trend Renal Function carefully &  repeat CMP in the AM    Abnormal LFTs: Likely reactive and Improving; AST went from 37 -> 80 -> 61 and ALT went from 15 -> 62 -> 28. CTM and Trend Hepatic Fxn Panel; if not further improving or worsening will obtain a right upper quadrant ultrasound and acute hepatitis panel.  ABLA superimposed on Chronic Normocytic Anemia:  Recent Labs   Lab 08/28/24 0831 08/28/24 0842 08/28/24 1440 08/29/24 1404 08/30/24 0944 08/31/24 0613  HGB 11.7* 12.6 10.2* 7.7* 5.6* 9.1*  HCT 35.8* 37.0 31.8* 24.3* 16.7* 26.8*  MCV 97.5  --  98.1 99.6 95.4 90.5  -Type and Screen and Transfuse 2 units of pRBCs. CTM for S/Sx of bleeding; no overt bleeding noted. Repeat CBC in the AM  Thrombocytopenia: In the setting of Acute Blood loss from above. Plt Count went from 182 -> 162 -> 164 -> 125 -> 138. CTM for S/Sx of Bleeding; no overt bleeding noted. Repeat CBC in the AM   Hyperbilirubinemia: Likely Reactive. T Bili went from 0.7 -> 1.3. (Received 2 units of pRBC's yesterday). CTM and Trend and repeat CMP in the AM   Hypoalbuminemia: Patient's Albumin Lvl went from 3.7 -> 2.8 -> 2.7. CTM & Trend & repeat CMP in the AM   DVT prophylaxis: SCDs Start: 08/29/24 1333    Code Status: Full Code Family Communication: D/w Son @ bedside   Disposition Plan:  Level of care: Telemetry Status is: Inpatient Remains inpatient appropriate because: Needs SNF and needs to be out of restraints for 24 hours prior to D/Cing to SNF   Consultants:  Orthopedic Surgery  Procedures:  As delineated as above  Antimicrobials:  Anti-infectives (From admission, onward)    Start     Dose/Rate Route Frequency Ordered Stop   08/29/24 1600  ceFAZolin (ANCEF) IVPB 2g/100 mL premix        2 g 200 mL/hr over 30 Minutes Intravenous Every 6 hours 08/29/24 1333 08/30/24 1329   08/29/24 0845  ceFAZolin (ANCEF) IVPB 2g/100 mL premix        2 g 200 mL/hr over 30 Minutes Intravenous On call to O.R. 08/29/24 0839 08/29/24 1020       Subjective: Seen and examined at bedside and is pleasantly confused and demented.  Kept her eyes closed during the entire encounter.  Denied any chest pain or shortness of breath.  Family at bedside and thinks she is doing okay.  Unfortunately she had to have soft mittens and had a abdominal restraint placed on her last night due to her behavioral  disturbances.  Blood count has appropriately responded and there is no other concerns or complaints at this time.  Objective: Vitals:   08/30/24 2042 08/30/24 2047 08/31/24 0400 08/31/24 0700  BP: (!) 148/112  (!) 168/79 (!) 176/75  Pulse: 83  76 75  Resp: 16  16 17   Temp: 98 F (36.7 C)  97.7 F (36.5 C) 97.8 F (36.6 C)  TempSrc: Oral  Oral Axillary  SpO2: (!) 88% 93% 97% 93%  Weight:      Height:        Intake/Output Summary (Last 24 hours) at 08/31/2024 1431 Last data filed at 08/31/2024 0900 Gross per 24 hour  Intake 630 ml  Output --  Net 630 ml   Filed Weights   08/28/24 0814  Weight: 59 kg   Examination: Physical Exam:  Constitutional: Thin chronically ill-appearing elderly Caucasian female who currently resting but is very demented and confused at this time  Respiratory: Diminished to auscultation bilaterally, no wheezing, rales, rhonchi or crackles. Normal respiratory effort and patient is not tachypenic. No accessory muscle use.  Unlabored breathing Cardiovascular: RRR, no murmurs / rubs / gallops. S1 and S2 auscultated. No extremity edema.  Abdomen: Soft, non-tender, non-distended. Bowel sounds positive.  GU: Deferred. Musculoskeletal: No clubbing / cyanosis of digits/nails. No joint deformity upper and lower extremities.  Wearing soft mittens Skin: No rashes, lesions, ulcers on limited skin evaluation. No induration; Warm and dry.  Neurologic: Kept her eyes closed during entire encounter but answered questions appropriately and spontaneously moving her extremities. Psychiatric: Impaired judgment and send she is not fully alert or awake and oriented  Data Reviewed: I have personally reviewed following labs and imaging studies  CBC: Recent Labs  Lab 08/28/24 0831 08/28/24 0842 08/28/24 1440 08/29/24 1404 08/30/24 0944 08/31/24 0613  WBC 8.1  --  6.6 9.4 8.3 8.7  NEUTROABS 6.6  --   --   --  6.1 6.3  HGB 11.7* 12.6 10.2* 7.7* 5.6* 9.1*  HCT 35.8* 37.0  31.8* 24.3* 16.7* 26.8*  MCV 97.5  --  98.1 99.6 95.4 90.5  PLT 182  --  162 164 125* 138*   Basic Metabolic Panel: Recent Labs  Lab 08/28/24 0831 08/28/24 0842 08/28/24 1440 08/29/24 1404 08/30/24 0944 08/31/24 0613  NA 138 138  --   --  135 139  K 3.6 3.5  --   --  3.6 3.9  CL 101 98  --   --  101 99  CO2 25  --   --   --  24 30  GLUCOSE 101* 99  --   --  119* PENDING  BUN 13 17  --   --  28* 24*  CREATININE 1.09* 1.00 0.98 1.02* 1.19* 0.94  CALCIUM 8.9  --   --   --  8.0* 8.5*  MG  --   --   --   --  1.9 1.9  PHOS  --   --   --   --  3.3 3.0   GFR: Estimated Creatinine Clearance: 38.1 mL/min (by C-G formula based on SCr of 0.94 mg/dL). Liver Function Tests: Recent Labs  Lab 08/28/24 0831 08/30/24 0944 08/31/24 0613  AST 37 80* 61*  ALT 15 62* 28  ALKPHOS 59 62 64  BILITOT 0.9 0.7 1.3*  PROT 6.2* 4.8* 5.5*  ALBUMIN 3.7 2.8* 2.7*   No results for input(s): LIPASE, AMYLASE in the last 168 hours. No results for input(s): AMMONIA in the last 168 hours. Coagulation Profile: No results for input(s): INR, PROTIME in the last 168 hours. Cardiac Enzymes: No results for input(s): CKTOTAL, CKMB, CKMBINDEX, TROPONINI in the last 168 hours. BNP (last 3 results) No results for input(s): PROBNP in the last 8760 hours. HbA1C: No results for input(s): HGBA1C in the last 72 hours. CBG: No results for input(s): GLUCAP in the last 168 hours. Lipid Profile: No results for input(s): CHOL, HDL, LDLCALC, TRIG, CHOLHDL, LDLDIRECT in the last 72 hours. Thyroid Function Tests: No results for input(s): TSH, T4TOTAL, FREET4, T3FREE, THYROIDAB in the last 72 hours. Anemia Panel: No results for input(s): VITAMINB12, FOLATE, FERRITIN, TIBC, IRON, RETICCTPCT in the last 72 hours. Sepsis Labs: No results for input(s): PROCALCITON, LATICACIDVEN in the last 168 hours.  Recent Results (from the past 240 hours)  Culture, blood  (Routine X 2) w Reflex to ID Panel     Status: None (Preliminary result)   Collection Time: 08/28/24  7:04 PM   Specimen: BLOOD LEFT ARM  Result Value Ref Range Status   Specimen Description BLOOD LEFT ARM  Final   Special Requests   Final    BOTTLES DRAWN AEROBIC AND ANAEROBIC Blood Culture results may not be optimal due to an inadequate volume of blood received in culture bottles   Culture   Final    NO GROWTH 3 DAYS Performed at [facility], [address], [place], [place] [address]    Report Status PENDING  Incomplete  Culture, blood (Routine X 2) w Reflex to ID Panel     Status: None (Preliminary result)   Collection Time: 08/28/24  7:09 PM   Specimen: BLOOD LEFT HAND  Result Value Ref Range Status   Specimen Description BLOOD LEFT HAND  Final   Special Requests   Final    BOTTLES DRAWN AEROBIC AND ANAEROBIC Blood Culture adequate volume   Culture  Setup Time NO ORGANISMS SEEN ANAEROBIC BOTTLE ONLY   Final   Culture   Final    NO GROWTH 3 DAYS Performed at [facility], [address] [place], [place] [address]    Report Status PENDING  Incomplete  Surgical pcr screen     Status: None   Collection Time: 08/29/24 12:40 AM   Specimen: Nasal Mucosa; Nasal Swab  Result Value Ref Range Status   MRSA, PCR NEGATIVE NEGATIVE Final   Staphylococcus aureus NEGATIVE NEGATIVE Final    Comment: (NOTE) The Xpert SA Assay (FDA approved for NASAL specimens in patients 24 years of age and older), is one component of a comprehensive surveillance program. It is not intended to diagnose infection nor to guide or monitor treatment. Performed at [facility], [address], [place], [place] [address]     Radiology Studies: No results found.  Scheduled Meds:  Chlorhexidine Gluconate Cloth  6 each Topical Daily   divalproex  125 mg Oral QPM   donepezil   2.5 mg Oral QODAY   escitalopram  15 mg Oral Daily   memantine   10 mg Oral BID   polyethylene glycol  17 g Oral  BID   senna-docusate  1 tablet Oral BID   Continuous Infusions:   LOS: 3 days   Alejandro Marker, DO [facility] Available via Epic secure chat 7am-7pm After these hours, please refer to coverage provider listed on [email or web address] 08/31/2024, 2:31 PM

## 2024-08-31 NOTE — TOC Initial Note (Signed)
 Transition of Care [facility]) - Initial/Assessment Note    Patient Details  Name: Cheryl Olson MRN: [id number] Date of Birth: [birth year]  [facility]) CM/SW Contact:    Bridget Cordella Simmonds, LCSW Phone Number: 08/31/2024, 3:18 PM  Clinical Narrative:     Pt oriented x0.  CSW spoke with son Gaither and daughter [name], who provide all information.  Pt from [facility] memory care.  Normally is verbal and was walking independently at the facility.  They recently ordered a walker but pt had not begun to use it.     They are agreeable to SNF, medicare choice document provided, permission given to send out referral in the hub.  Pt other son, Legrand works in gerontology and has some ideas on SNF choice.  Referral sent out in the hub for SNF.           Expected Discharge Plan: Skilled Nursing Facility Barriers to Discharge: Continued Medical Work up, SNF Pending bed offer   Patient Goals and CMS Choice   CMS Medicare.gov Compare Post Acute Care list provided to:: Patient Represenative (must comment) (daughter [name]) Choice offered to / list presented to : Adult Children      Expected Discharge Plan and Services In-house Referral: Clinical Social Work   Post Acute Care Choice: Skilled Nursing Facility Living arrangements for the past 2 months: Assisted Living Facility [facility])                                      Prior Living Arrangements/Services Living arrangements for the past 2 months: Assisted Living Facility [facility]) Lives with:: Facility Resident Patient language and need for interpreter reviewed:: No        Need for Family Participation in Patient Care: Yes (Comment) Care giver support system in place?: Yes (comment) Current home services: Other (comment) (na) Criminal Activity/Legal Involvement Pertinent to Current Situation/Hospitalization: No - Comment as needed  Activities of Daily Living      Permission Sought/Granted                   Emotional Assessment Appearance:: Appears stated age Attitude/Demeanor/Rapport: Unable to Assess Affect (typically observed): Unable to Assess Orientation: :  (not oriented)      Admission diagnosis:  Femur fracture, right (HCC) [S72.91XA] Closed displaced comminuted fracture of shaft of right femur, initial encounter (HCC) [S72.351A] Fall, initial encounter [W19.XXXA] Dementia, unspecified dementia severity, unspecified dementia type, unspecified whether behavioral, psychotic, or mood disturbance or anxiety (HCC) [F03.90] Patient Active Problem List   Diagnosis Date Noted   Femur fracture, right (HCC) 08/28/2024   Accelerated hypertension 05/31/2012   Lightheadedness 05/31/2012   COPD (chronic obstructive pulmonary disease) (HCC) 05/31/2012   Hyponatremia 05/31/2012   PCP:  Larnell Hamilton, MD Pharmacy:   [id number] [place], [place] - [address] AT [facility] OF [address] [place] [address] Phone: [phone] Fax: [phone]  [id number] - [place], [place] - [address] AT [facility] [address] [place] [address] Phone: [phone] Fax: [phone]  [facility] - [place] , [place] - [address] [place]  [place] [address] Phone: [phone] Fax: [phone]     Social Drivers of Health (SDOH) Social History: SDOH Screenings   Food Insecurity: No Food Insecurity (08/28/2024)  Housing: Low Risk  (  08/28/2024)  Transportation Needs: No Transportation Needs (08/28/2024)  Utilities: Not At Risk (08/28/2024)  Social Connections: Socially Isolated (08/28/2024)  Tobacco Use: Medium Risk (08/29/2024)   SDOH Interventions:     Readmission Risk Interventions     No data to display

## 2024-08-31 NOTE — NC FL2 (Signed)
 [place]  MEDICAID FL2 LEVEL OF CARE FORM     IDENTIFICATION  Patient Name: Cheryl Olson Birthdate: 1941-07-18 Sex: female Admission Date (Current Location): 08/28/2024  [facility] [place] Number:  Producer, Television/film/video and Address:  [facility] [place], [address], [place], [place] [address]      Provider Number: [id number]  Attending Physician Name and Address:  Sherrill Alejandro Donovan, DO  Relative Name and Phone Number:  Bearl Krabbe Daughter   [phone]    Current Level of Care: Hospital Recommended Level of Care: Skilled Nursing Facility Prior Approval Number:    Date Approved/Denied:   PASRR Number: [id number]  Discharge Plan: SNF    Current Diagnoses: Patient Active Problem List   Diagnosis Date Noted   Femur fracture, right (HCC) 08/28/2024   Accelerated hypertension 05/31/2012   Lightheadedness 05/31/2012   COPD (chronic obstructive pulmonary disease) (HCC) 05/31/2012   Hyponatremia 05/31/2012    Orientation RESPIRATION BLADDER Height & Weight      (not oriented)  O2 Incontinent Weight: 130 lb (59 kg) Height:  5' 1 (154.9 cm)  BEHAVIORAL SYMPTOMS/MOOD NEUROLOGICAL BOWEL NUTRITION STATUS      Incontinent Diet (see discharge summary)  AMBULATORY STATUS COMMUNICATION OF NEEDS Skin   Total Care Verbally Surgical wounds                       Personal Care Assistance Level of Assistance  Bathing, Feeding, Dressing, Total care Bathing Assistance: Maximum assistance Feeding assistance: Maximum assistance Dressing Assistance: Maximum assistance Total Care Assistance: Maximum assistance   Functional Limitations Info  Sight, Hearing, Speech Sight Info: Adequate Hearing Info: Adequate Speech Info: Adequate    SPECIAL CARE FACTORS FREQUENCY  PT (By licensed PT), OT (By licensed OT)     PT Frequency: 5x week OT Frequency: 5x week            Contractures Contractures Info: Not present    Additional Factors  Info  Code Status, Allergies Code Status Info: full Allergies Info: [place]           Current Medications (08/31/2024):  This is the current hospital active medication list Current Facility-Administered Medications  Medication Dose Route Frequency Provider Last Rate Last Admin   Chlorhexidine Gluconate Cloth 2 % PADS 6 each  6 each Topical Daily Elsa Lonni SAUNDERS, MD   6 each at 08/31/24 0953   divalproex (DEPAKOTE) DR tablet 125 mg  125 mg Oral QPM Elsa Lonni SAUNDERS, MD   125 mg at 08/30/24 1708   donepezil  (ARICEPT ) tablet 2.5 mg  2.5 mg Oral QODAY Uhlorn, Garett M, RPH       escitalopram (LEXAPRO) tablet 15 mg  15 mg Oral Daily Elsa Lonni SAUNDERS, MD   15 mg at 08/31/24 0951   haloperidol lactate (HALDOL) injection 2 mg  2 mg Intravenous Q4H PRN Daniels, James K, NP   2 mg at 08/30/24 1251   hydrALAZINE  (APRESOLINE ) tablet 25 mg  25 mg Oral Q8H PRN Elsa Lonni SAUNDERS, MD       ipratropium-albuterol  (DUONEB) 0.5-2.5 (3) MG/3ML nebulizer solution 3 mL  3 mL Nebulization Q6H PRN Elsa Lonni SAUNDERS, MD       LORazepam  (ATIVAN ) injection 1 mg  1 mg Intravenous Q6H PRN Sheikh, Omair Latif, DO       memantine  (NAMENDA ) tablet 10 mg  10 mg Oral BID Elsa Lonni SAUNDERS, MD   10 mg at 08/31/24 0951   metoCLOPramide (REGLAN) tablet  5-10 mg  5-10 mg Oral Q8H PRN Elsa Lonni SAUNDERS, MD       Or   metoCLOPramide (REGLAN) injection 5-10 mg  5-10 mg Intravenous Q8H PRN Elsa Lonni SAUNDERS, MD       morphine (PF) 2 MG/ML injection 1 mg  1 mg Intravenous Q4H PRN Elsa Lonni SAUNDERS, MD   1 mg at 08/31/24 0459   ondansetron  (ZOFRAN ) tablet 4 mg  4 mg Oral Q6H PRN Elsa Lonni SAUNDERS, MD       Or   ondansetron  (ZOFRAN ) injection 4 mg  4 mg Intravenous Q6H PRN Elsa Lonni SAUNDERS, MD       polyethylene glycol (MIRALAX / GLYCOLAX) packet 17 g  17 g Oral BID Sheikh, Omair Latif, DO       senna-docusate (Senokot-S) tablet 1 tablet  1 tablet Oral BID Sheikh, Omair Latif, [place]   1 tablet at  08/31/24 0951   traZODone (DESYREL) tablet 50 mg  50 mg Oral QHS PRN Elsa Lonni SAUNDERS, MD   50 mg at 08/30/24 2049     Discharge Medications: Please see discharge summary for a list of discharge medications.  Relevant Imaging Results:  Relevant Lab Results:   Additional Information SSN: [id number]  Bridget Cordella Simmonds, LCSW

## 2024-08-31 NOTE — Progress Notes (Signed)
     Cheryl Olson is a 83 y.o. female   Orthopaedic diagnosis: Status post retrograde nailing of right distal femur fracture 08/29/2024  Subjective: Patient sleeping during rounds.  She appears comfortable.  Family at bedside.  Hemoglobin up to 9.1 today.  She has worked with therapy.  Prior to admission, she resided at a long-term memory care facility.  Objectyive: Vitals:   08/31/24 0400 08/31/24 0700  BP: (!) 168/79 (!) 176/75  Pulse: 76 75  Resp: 16 17  Temp: 97.7 F (36.5 C) 97.8 F (36.6 C)  SpO2: 97% 93%     Exam: Awake and alert Respirations even and unlabored No acute distress  Right leg with Ace wrap and dressing in place.  Leg is held in extension.  No gross deformity.  Calf is soft and nontender and foot is warm and well-perfused distally.  Assessment: Postop day 2 status post the above  Plan: - Touchdown weightbearing right lower extremity - Continue PT/OT. - Pain control as needed - Lovenox  for DVT prophylaxis while inpatient, transition to 325 aspirin daily x 30 days at discharge. - Discharge per primary team  Follow-up 2 weeks after surgery Dr. Medford Pae for radiographs of the operative femur and likely staple removal.   Cheryl Olson J. Myia Bergh, PA-C

## 2024-09-01 DIAGNOSIS — S7291XA Unspecified fracture of right femur, initial encounter for closed fracture: Secondary | ICD-10-CM | POA: Diagnosis not present

## 2024-09-01 DIAGNOSIS — R7989 Other specified abnormal findings of blood chemistry: Secondary | ICD-10-CM | POA: Diagnosis not present

## 2024-09-01 DIAGNOSIS — F03918 Unspecified dementia, unspecified severity, with other behavioral disturbance: Secondary | ICD-10-CM | POA: Diagnosis not present

## 2024-09-01 DIAGNOSIS — E43 Unspecified severe protein-calorie malnutrition: Secondary | ICD-10-CM | POA: Insufficient documentation

## 2024-09-01 LAB — COMPREHENSIVE METABOLIC PANEL WITH GFR
ALT: 23 U/L (ref 0–44)
AST: 49 U/L — ABNORMAL HIGH (ref 15–41)
Albumin: 3 g/dL — ABNORMAL LOW (ref 3.5–5.0)
Alkaline Phosphatase: 62 U/L (ref 38–126)
Anion gap: 16 — ABNORMAL HIGH (ref 5–15)
BUN: 17 mg/dL (ref 8–23)
CO2: 25 mmol/L (ref 22–32)
Calcium: 8.5 mg/dL — ABNORMAL LOW (ref 8.9–10.3)
Chloride: 94 mmol/L — ABNORMAL LOW (ref 98–111)
Creatinine, Ser: 0.82 mg/dL (ref 0.44–1.00)
GFR, Estimated: >60 mL/min (ref >60)
Glucose, Bld: 99 mg/dL (ref 70–99)
Potassium: 3.3 mmol/L — ABNORMAL LOW (ref 3.5–5.1)
Sodium: 135 mmol/L (ref 135–145)
Total Bilirubin: 1.4 mg/dL — ABNORMAL HIGH (ref 0.0–1.2)
Total Protein: 5.7 g/dL — ABNORMAL LOW (ref 6.5–8.1)

## 2024-09-01 LAB — CBC WITH DIFFERENTIAL/PLATELET
Abs Immature Granulocytes: 0.04 K/uL (ref 0.00–0.07)
Basophils Absolute: 0 K/uL (ref 0.0–0.1)
Basophils Relative: 0 %
Eosinophils Absolute: 0 K/uL (ref 0.0–0.5)
Eosinophils Relative: 1 %
HCT: 28.2 % — ABNORMAL LOW (ref 36.0–46.0)
Hemoglobin: 9.6 g/dL — ABNORMAL LOW (ref 12.0–15.0)
Immature Granulocytes: 1 %
Lymphocytes Relative: 11 %
Lymphs Abs: 0.8 K/uL (ref 0.7–4.0)
MCH: 31 pg (ref 26.0–34.0)
MCHC: 34 g/dL (ref 30.0–36.0)
MCV: 91 fL (ref 80.0–100.0)
Monocytes Absolute: 0.8 K/uL (ref 0.1–1.0)
Monocytes Relative: 11 %
Neutro Abs: 5.6 K/uL (ref 1.7–7.7)
Neutrophils Relative %: 76 %
Platelets: 175 K/uL (ref 150–400)
RBC: 3.1 MIL/uL — ABNORMAL LOW (ref 3.87–5.11)
RDW: 15.3 % (ref 11.5–15.5)
WBC: 7.3 K/uL (ref 4.0–10.5)
nRBC: 0 % (ref 0.0–0.2)

## 2024-09-01 LAB — PHOSPHORUS: Phosphorus: 2.5 mg/dL (ref 2.5–4.6)

## 2024-09-01 LAB — MAGNESIUM: Magnesium: 1.8 mg/dL (ref 1.7–2.4)

## 2024-09-01 MED ORDER — ADULT MULTIVITAMIN W/MINERALS CH
1.0000 | ORAL_TABLET | Freq: Every day | ORAL | Status: DC
  Administered 2024-09-02: 1 via ORAL
  Filled 2024-09-01: qty 1

## 2024-09-01 MED ORDER — ENSURE PLUS HIGH PROTEIN PO LIQD
237.0000 mL | Freq: Two times a day (BID) | ORAL | Status: DC
  Administered 2024-09-02: 237 mL via ORAL

## 2024-09-01 MED ORDER — POTASSIUM CHLORIDE CRYS ER 20 MEQ PO TBCR
40.0000 meq | EXTENDED_RELEASE_TABLET | Freq: Two times a day (BID) | ORAL | Status: AC
  Administered 2024-09-01 (×2): 40 meq via ORAL
  Filled 2024-09-01 (×2): qty 2

## 2024-09-01 MED ORDER — MAGNESIUM SULFATE 2 GM/50ML IV SOLN
2.0000 g | Freq: Once | INTRAVENOUS | Status: AC
  Administered 2024-09-01: 2 g via INTRAVENOUS
  Filled 2024-09-01: qty 50

## 2024-09-01 NOTE — TOC Progression Note (Addendum)
 Transition of Care [facility]) - Progression Note    Patient Details  Name: Cheryl Olson MRN: [id number] Date of Birth: [birth year]  Transition [facility]) CM/SW Contact  Bridget Cordella Simmonds, LCSW Phone Number: 09/01/2024, 11:22 AM  Clinical Narrative:   Bed offer at [facility] provided to son Quintin.  He will talk with family.  1400: CSW left message with daughter [name]  1550: CSW spoke with pt son Quintin: they do want to accept offer at [facility].  CSW confirmed with [employer].  SNF auth request submitted in [place].    Expected Discharge Plan: Skilled Nursing Facility Barriers to Discharge: Continued Medical Work up, SNF Pending bed offer               Expected Discharge Plan and Services In-house Referral: Clinical Social Work   Post Acute Care Choice: Skilled Nursing Facility Living arrangements for the past 2 months: Assisted Living Facility [facility])                                       Social Drivers of Health (SDOH) Interventions SDOH Screenings   Food Insecurity: No Food Insecurity (08/28/2024)  Housing: Low Risk  (08/28/2024)  Transportation Needs: No Transportation Needs (08/28/2024)  Utilities: Not At Risk (08/28/2024)  Social Connections: Socially Isolated (08/28/2024)  Tobacco Use: Medium Risk (08/29/2024)    Readmission Risk Interventions     No data to display

## 2024-09-01 NOTE — Progress Notes (Signed)
 Physical Therapy Treatment Patient Details Name: Cheryl Olson MRN: [id number] DOB: 1941/01/02 Today's Date: 09/01/2024   History of Present Illness Pt is a 83 y.o. female admitted 08/28/24 after being found down after an unwitnessed fall sustaining displaced and comminuted right distal femur fx. Pt s/p IM nail 11/8. PMHx: dementia, COPD, and asthma. Family reports hx of vasovagal syncope.    PT Comments  Pt resting in bed on arrival, lethargic throughout session with pt keeping her eyes closed most of session, however able to follow some simple single step commands. Pt able to come to upright standing this session with max A with HHA with pt clearing her hips from sitting surface x1. Pt attempting x2 more times with pt unable to clear hips with total A as pt with poor LE engagement. Pt returning to supine at end of session with bed positioned in chair position. Pt continues to benefit from skilled PT services to progress toward functional mobility goals.     If plan is discharge home, recommend the following: Two people to help with walking and/or transfers;Two people to help with bathing/dressing/bathroom;Assistance with cooking/housework;Assist for transportation;Help with stairs or ramp for entrance;Supervision due to cognitive status;Direct supervision/assist for medications management;Direct supervision/assist for financial management   Can travel by private vehicle     No  Equipment Recommendations  Wheelchair (measurements PT);Wheelchair cushion (measurements PT);BSC/3in1;Rolling walker (2 wheels)    Recommendations for Other Services       Precautions / Restrictions Precautions Precautions: Fall Recall of Precautions/Restrictions: Impaired Precaution/Restrictions Comments: dementia Restrictions Weight Bearing Restrictions Per Provider Order: Yes RLE Weight Bearing Per Provider Order: Touchdown weight bearing Other Position/Activity Restrictions: OK'ed for WBAT for standing  and pivots by Dr. Elsa 08/30/24     Mobility  Bed Mobility Overal bed mobility: Needs Assistance Bed Mobility: Supine to Sit, Sit to Supine     Supine to sit: Max assist, HOB elevated, Used rails Sit to supine: Max assist   General bed mobility comments: pt requiring total A to initiate to EOB, pt able to being to elevate trunk to sititng, pt lowering trunk at end of session    Transfers Overall transfer level: Needs assistance Equipment used: 1 person hand held assist Transfers: Sit to/from Stand Sit to Stand: Total assist, From elevated surface, Max assist           General transfer comment: pt standing with max A from elevated EOB on initial stand, total A for x2 more attempts with pt unable to extend knees to achieve upright standing    Ambulation/Gait               General Gait Details: Unable   Stairs             Wheelchair Mobility     Tilt Bed    Modified Rankin (Stroke Patients Only)       Balance Overall balance assessment: Needs assistance Sitting-balance support: Bilateral upper extremity supported, Feet supported (Pt reaches out for therapy assist with seated balance) Sitting balance-Leahy Scale: Poor Sitting balance - Comments: brief moments of CGA, mostly requiring mod to max A left lateral lean and posterior lean (trying to offweight RLE/hip) Postural control: Left lateral lean, Posterior lean Standing balance support: Bilateral upper extremity supported Standing balance-Leahy Scale: Zero Standing balance comment: 100% dependent on therapists, unable to achieve full upright  Communication Communication Communication: Impaired Factors Affecting Communication: Difficulty expressing self  Cognition Arousal: Lethargic Behavior During Therapy: Restless   PT - Cognitive impairments: History of cognitive impairments (Dementia)                       PT - Cognition Comments: Pt with eyes  closed through majority of session. She occasionally would briefly open her eyes. Following commands: Impaired Following commands impaired: Follows one step commands inconsistently    Cueing Cueing Techniques: Verbal cues, Tactile cues  Exercises      General Comments General comments (skin integrity, edema, etc.): pt son Ray present and supportive throughout session      Pertinent Vitals/Pain Pain Assessment Pain Assessment: Faces Faces Pain Scale: Hurts little more Pain Location: RLE Pain Descriptors / Indicators: Grimacing, Guarding Pain Intervention(s): Monitored during session, Limited activity within patient's tolerance    Home Living                          Prior Function            PT Goals (current goals can now be found in the care plan section) Acute Rehab PT Goals Patient Stated Goal: Have pt get back to walking PT Goal Formulation: With family Time For Goal Achievement: 09/13/24 Progress towards PT goals: Progressing toward goals    Frequency    Min 2X/week      PT Plan      Co-evaluation              AM-PAC PT 6 Clicks Mobility   Outcome Measure  Help needed turning from your back to your side while in a flat bed without using bedrails?: Total Help needed moving from lying on your back to sitting on the side of a flat bed without using bedrails?: Total Help needed moving to and from a bed to a chair (including a wheelchair)?: Total Help needed standing up from a chair using your arms (e.g., wheelchair or bedside chair)?: Total Help needed to walk in hospital room?: Total Help needed climbing 3-5 steps with a railing? : Total 6 Click Score: 6    End of Session   Activity Tolerance: Patient limited by lethargy;Patient limited by pain Patient left: in bed;with call bell/phone within reach;with bed alarm set;with family/visitor present;Other (comment) (with bed in parital chair position) Nurse Communication: Mobility status PT  Visit Diagnosis: Difficulty in walking, not elsewhere classified (R26.2);Other abnormalities of gait and mobility (R26.89);Unsteadiness on feet (R26.81);Muscle weakness (generalized) (M62.81);Pain Pain - Right/Left: Right Pain - part of body: Hip     Time: 8955-8889 PT Time Calculation (min) (ACUTE ONLY): 26 min  Charges:    $Therapeutic Activity: 23-37 mins PT General Charges $$ ACUTE PT VISIT: 1 Visit                     Brion Sossamon R. PTA Acute Rehabilitation Services Office: [phone]   Therisa CHRISTELLA Boor 09/01/2024, 11:18 AM

## 2024-09-01 NOTE — Progress Notes (Signed)
 Initial Nutrition Assessment  DOCUMENTATION CODES:   Severe malnutrition in context of social or environmental circumstances  INTERVENTION:  Continue Regular diet Not room service appropriate Encourage PO intake  Ensure Plus High Protein po BID, each supplement provides 350 kcal and 20 grams of protein. Add MVI w/minerals daily  Order Vitamin D Hydroxy lab, order repletion dose as necessary    NUTRITION DIAGNOSIS:   Severe Malnutrition related to social / environmental circumstances as evidenced by moderate fat depletion, severe muscle depletion.   GOAL:   Patient will meet greater than or equal to 90% of their needs   MONITOR:   PO intake, Supplement acceptance, Weight trends, Labs  REASON FOR ASSESSMENT:   Consult Assessment of nutrition requirement/status  ASSESSMENT:   medical history significant of dementia, and COPD who p/w unwitnessed GLF c/b R femur fracture  11/08 - IMN  Patient seen in room sleeping, did not awake to voice. Dementia at baseline and came from memory care unit. Bilateral mittens in place. Breakfast (33% intake (all of her eggs)) and lunch tray (untouched) in room. Pt day 3 post op IMN. Plan to discharge to SNF once medically stable, will need to be out of restraints for at least 24 hrs. Moderate to severe fat loss and muscle wasting on exam, meets criteria for malnutrition.   Admit weight: 59 kg  Current weight:  50.6 kg No weight method documented for admission weight, bedscale weight taken and updated in chart. New weight aligns more with current weight hx in chart, admission weight likely to have been falsely elevated.      Wt Readings from Last 10 Encounters:  09/01/24 50.6 kg  11/13/23 45.4 kg  04/02/23 47.6 kg  08/23/22 47.6 kg  01/04/16 55.8 kg  06/02/15 56.7 kg  12/31/14 56.3 kg  09/03/14 56.2 kg  09/14/13 59 kg  07/21/12 58.5 kg   Nutritionally Relevant Medications: miralax, senokot-s  Labs Reviewed: K 3.3, Cl 94, Calcium  8.5, T Bili 1.4    NUTRITION - FOCUSED PHYSICAL EXAM:  Flowsheet Row Most Recent Value  Orbital Region Mild depletion  Upper Arm Region Moderate depletion  Thoracic and Lumbar Region Moderate depletion  Buccal Region Mild depletion  Temple Region Moderate depletion  Clavicle Bone Region Severe depletion  Clavicle and Acromion Bone Region Severe depletion  Dorsal Hand Unable to assess  [mittnes]  Patellar Region Moderate depletion  Anterior Thigh Region Moderate depletion  Posterior Calf Region Moderate depletion  Eyes Unable to assess  [sleeping]  Mouth Unable to assess  [sleeping]  Skin Reviewed  Nails Unable to assess  [mittens]    Diet Order:   Diet Order             Diet regular Room service appropriate? No; Fluid consistency: Thin  Diet effective now                   EDUCATION NEEDS:   Not appropriate for education at this time  Skin:  Skin Assessment: Skin Integrity Issues: Skin Integrity Issues:: Incisions Incisions: surgical, right leg  Last BM:  PTA  Height:   Ht Readings from Last 1 Encounters:  08/28/24 5' 1 (1.549 m)    Weight:   Wt Readings from Last 1 Encounters:  09/01/24 50.6 kg    BMI:  Body mass index is 21.08 kg/m.  Estimated Nutritional Needs:   Kcal:  8749-8481  Protein:  60-75 grams  Fluid:  1.2-1.5 L/d   Cheryl Potters, MS, RD, LDN Clinical  Dietitian  Contact via secure chat. If unavailable, use group chat RD Inpatient.

## 2024-09-01 NOTE — Progress Notes (Addendum)
 PROGRESS NOTE    Cheryl Olson  FMW:[id number] DOB: May 10, 1941 DOA: 08/28/2024 PCP: Larnell Hamilton, MD   Brief Narrative:  Cheryl Olson is a 83 y.o. female with a history of dementia and COPD.  Patient presented secondary to being found down after an unwitnessed fall and found to have a right femur fracture. Syncope workup started and orthopedic surgery consulted for management of fracture.  She underwent IM nailing of the right distal femur fracture which was retrograde and she is postoperative day 2.  Subsequently she was found to have acute blood loss anemia and she was typed and screened and transfused 2 units of PRBCs.    Hemoglobin is improved along with several other labs however she continues to have behavioral disturbances and had to have a waist restraint placed on her overnight.  Will remove and continue to monitor as PT/OT recommending SNF. She is improved and medically stable to D/C to SNF and anticipating D/C on 09/02/24 if remains out of restraints.   Assessment and Plan:  Possible Syncope: Unwitnessed fall. Complicated by underlying dementia as patient cannot give a history, so syncope could not be ruled out. Transthoracic Echocardiogram ordered and is with LVEF >70% and no aortic stenosis. EEG ordered and declined per family. Head CT done and showed No acute intracranial abnormality. Mild to moderate chronic small vessel ischemic disease. Cervical CT spine w/o Contrast showed no acute cervical spine fracture or traumatic malalignment and Advanced disc and facet degeneration.. -UDS ordered but not completed. Alcohol level undetectable. Blood Cx x2 @ 4 Days. Orthostatics deferred secondary to leg fracture. Per family, patient has a history of vasovagal syncope, which could be the etiology. Continue Telemetry Monitoring. PT/OT eval reccomending SNF   Right Femur Fracture: Secondary to unwitnessed fall. Orthopedic surgery consulted with plan for surgical management 11/8.  Underwent IM nailing of the R Distal Femur Fx. Ortho recommending TDWB on the Right Leg. C/w Enoxaparin  40 mg Daily for DVT Ppx and then transition to 325 aspirin daily for 30 days at discharge. C/w Pain Control with IV Morphine 1 mg q4hprn.  Continue supportive care with ondansetron  4 mg p.o./IV every 6 as needed for nausea.  Will add a bowel regimen with senna docusate 1 tab p.o. twice daily (discontinuing the Docusate 100 mg po BID) and MiraLAX 17 g p.o. twice daily.  She will need to follow-up with orthopedic surgery Dr. Elsa for staple removal and repeat radiographs of the operative femur in 2 weeks   COPD: Continue Duonebs as needed 3 mL q6hprn Wheezing. CTM Respiratory Status carefully   Dementia: Noted. Patient lives at a memory care facility. C/w Donepezil  2.5 mg po EOD, Escitalopram 15 mg po Daily, Divalproex 125 mg po Daily, Memantine  10 mg po BID. Delirium Precautions and continue trazodone 50 mg p.o. nightly as needed sleep, mood/anxiety disorder; continue with 2 mg of IV Haldol every 4 as needed agitation but will now start IV Lorazepam  1 mg q6hprn Anxiety; Given her behavioral disturbances she was placed in a soft waist restraints but these were removed 11/10; Will need to remain out of restraints to go to SNF 11/12  Hypokalemia: K+ is 3.3. Replete w/ po Kcl 40 mEQ BID x2. Mag Level was 1.8 so that was replete as well. CTM and Replete as Necessary and repeat CMP in the AM  CKD Stage 3a / Elevated Anion Gap: Baseline Cr ranging from 0.9-1.1. BUN/Cr Trend improved and last check was 17/0.82.  She has a slightly elevated anion  gap of 16.  Avoid Nephrotoxic Medications, Contrast Dyes, Hypotension and Dehydration to Ensure Adequate Renal Perfusion and will need to Renally Adjust Meds; CTM & Trend Renal Function carefully & repeat CMP in the AM    Abnormal LFTs: Likely reactive and Improving; AST went from 37 -> 80 -> 61 -> 49 and ALT went from 15 -> 62 -> 28 -> 23. CTM and Trend Hepatic Fxn  Panel; if not further improving or worsening will obtain a right upper quadrant ultrasound and acute hepatitis panel.  ABLA superimposed on Chronic Normocytic Anemia: Stablized Recent Labs  Lab 08/28/24 0831 08/28/24 0842 08/28/24 1440 08/29/24 1404 08/30/24 0944 08/31/24 0613 09/01/24 0334  HGB 11.7* 12.6 10.2* 7.7* 5.6* 9.1* 9.6*  HCT 35.8* 37.0 31.8* 24.3* 16.7* 26.8* 28.2*  MCV 97.5  --  98.1 99.6 95.4 90.5 91.0  -Type and Screen and Transfuse 2 units of pRBCs. CTM for S/Sx of bleeding; no overt bleeding noted. Repeat CBC in the AM  Thrombocytopenia: Improved. In the setting of Acute Blood loss from above. Plt Count went from 182 -> 162 -> 164 -> 125 -> 138 -> 175. CTM for S/Sx of Bleeding; no overt bleeding noted. Repeat CBC in the AM   Hyperbilirubinemia: Likely Reactive. T Bili went from 0.7 -> 1.3 -> 1.4. (Received 2 units of pRBC's the day before yesterday). CTM and Trend and repeat CMP in the AM   Hypoalbuminemia: Patient's Albumin Lvl went from 3.7 -> 2.8 -> 2.7 -> 3.0. CTM & Trend & repeat CMP in the AM  Severe Malnutrition in the Context of Social Environmental Circumstances: Nutrition Status: Nutrition Problem: Severe Malnutrition Etiology: social / environmental circumstances Signs/Symptoms: moderate fat depletion, severe muscle depletion Interventions: Ensure Enlive (each supplement provides 350kcal and 20 grams of protein), MVI   DVT prophylaxis: SCDs Start: 08/29/24 1333    Code Status: Full Code Family Communication: Discussed with son at bedside  Disposition Plan:  Level of care: Telemetry Status is: Inpatient Remains inpatient appropriate because: Appears medically stable and can likely be discharged to SNF on 09/02/2024 if she is out of restraints for at least 24 hours   Consultants:  Orthopedic surgery  Procedures:  As delineated as above  Antimicrobials:  Anti-infectives (From admission, onward)    Start     Dose/Rate Route Frequency Ordered  Stop   08/29/24 1600  ceFAZolin (ANCEF) IVPB 2g/100 mL premix        2 g 200 mL/hr over 30 Minutes Intravenous Every 6 hours 08/29/24 1333 08/30/24 1329   08/29/24 0845  ceFAZolin (ANCEF) IVPB 2g/100 mL premix        2 g 200 mL/hr over 30 Minutes Intravenous On call to O.R. 08/29/24 0839 08/29/24 1020       Subjective: Seen and examined at bedside and she is resting and did not wake for the examination.  Son thinks she had an okay night.  Was able to stand up with therapy.  Son thinks she is doing okay and fairly well.  No other concerns or complaints at this time and hopeful for discharge tomorrow  Objective: Vitals:   09/01/24 0312 09/01/24 0853 09/01/24 1300 09/01/24 1535  BP: (!) 155/102 (!) 187/71  (!) 114/59  Pulse: 81 74  69  Resp: 16 15  14   Temp: 98 F (36.7 C) 98.2 F (36.8 C)  98.1 F (36.7 C)  TempSrc: Axillary Axillary    SpO2: 94% 98%    Weight:   50.6 kg  Height:        Intake/Output Summary (Last 24 hours) at 09/01/2024 1600 Last data filed at 09/01/2024 0900 Gross per 24 hour  Intake 0 ml  Output --  Net 0 ml   Filed Weights   08/28/24 0814 09/01/24 1300  Weight: 59 kg 50.6 kg   Examination: Physical Exam:  Constitutional: Extremely thin and frail elderly and chronically ill-appearing Caucasian female who is resting and asleep Respiratory: Diminished to auscultation bilaterally, no wheezing, rales, rhonchi or crackles. Normal respiratory effort and patient is not tachypenic. No accessory muscle use.  Unlabored breathing Cardiovascular: RRR, no murmurs / rubs / gallops. S1 and S2 auscultated. No extremity edema. Abdomen: Soft, non-tender, non-distended. Bowel sounds positive.  GU: Deferred. Musculoskeletal: No clubbing / cyanosis of digits/nails. No joint deformity upper and lower extremities.  No longer in restraints or wearing soft mittens Skin: No rashes, lesions, ulcers on limited skin evaluation. No induration; Warm and dry.  Neurologic: She is  somnolent and drowsy and asleep and did not wake up for the encounter Psychiatric: Calm and asleep  Data Reviewed: I have personally reviewed following labs and imaging studies  CBC: Recent Labs  Lab 08/28/24 0831 08/28/24 0842 08/28/24 1440 08/29/24 1404 08/30/24 0944 08/31/24 0613 09/01/24 0334  WBC 8.1  --  6.6 9.4 8.3 8.7 7.3  NEUTROABS 6.6  --   --   --  6.1 6.3 5.6  HGB 11.7*   < > 10.2* 7.7* 5.6* 9.1* 9.6*  HCT 35.8*   < > 31.8* 24.3* 16.7* 26.8* 28.2*  MCV 97.5  --  98.1 99.6 95.4 90.5 91.0  PLT 182  --  162 164 125* 138* 175   < > = values in this interval not displayed.   Basic Metabolic Panel: Recent Labs  Lab 08/28/24 0831 08/28/24 0842 08/28/24 1440 08/29/24 1404 08/30/24 0944 08/31/24 0613 09/01/24 0334  NA 138 138  --   --  135 139 135  K 3.6 3.5  --   --  3.6 3.9 3.3*  CL 101 98  --   --  101 99 94*  CO2 25  --   --   --  24 30 25   GLUCOSE 101* 99  --   --  119* 108* 99  BUN 13 17  --   --  28* 24* 17  CREATININE 1.09* 1.00 0.98 1.02* 1.19* 0.94 0.82  CALCIUM 8.9  --   --   --  8.0* 8.5* 8.5*  MG  --   --   --   --  1.9 1.9 1.8  PHOS  --   --   --   --  3.3 3.0 2.5   GFR: Estimated Creatinine Clearance: 39.9 mL/min (by C-G formula based on SCr of 0.82 mg/dL). Liver Function Tests: Recent Labs  Lab 08/28/24 0831 08/30/24 0944 08/31/24 0613 09/01/24 0334  AST 37 80* 61* 49*  ALT 15 62* 28 23  ALKPHOS 59 62 64 62  BILITOT 0.9 0.7 1.3* 1.4*  PROT 6.2* 4.8* 5.5* 5.7*  ALBUMIN 3.7 2.8* 2.7* 3.0*   No results for input(s): LIPASE, AMYLASE in the last 168 hours. No results for input(s): AMMONIA in the last 168 hours. Coagulation Profile: No results for input(s): INR, PROTIME in the last 168 hours. Cardiac Enzymes: No results for input(s): CKTOTAL, CKMB, CKMBINDEX, TROPONINI in the last 168 hours. BNP (last 3 results) No results for input(s): PROBNP in the last 8760 hours. HbA1C: No results for input(s): HGBA1C  in the  last 72 hours. CBG: No results for input(s): GLUCAP in the last 168 hours. Lipid Profile: No results for input(s): CHOL, HDL, LDLCALC, TRIG, CHOLHDL, LDLDIRECT in the last 72 hours. Thyroid Function Tests: No results for input(s): TSH, T4TOTAL, FREET4, T3FREE, THYROIDAB in the last 72 hours. Anemia Panel: No results for input(s): VITAMINB12, FOLATE, FERRITIN, TIBC, IRON, RETICCTPCT in the last 72 hours. Sepsis Labs: No results for input(s): PROCALCITON, LATICACIDVEN in the last 168 hours.  Recent Results (from the past 240 hours)  Culture, blood (Routine X 2) w Reflex to ID Panel     Status: None (Preliminary result)   Collection Time: 08/28/24  7:04 PM   Specimen: BLOOD LEFT ARM  Result Value Ref Range Status   Specimen Description BLOOD LEFT ARM  Final   Special Requests   Final    BOTTLES DRAWN AEROBIC AND ANAEROBIC Blood Culture results may not be optimal due to an inadequate volume of blood received in culture bottles   Culture   Final    NO GROWTH 4 DAYS Performed at [facility], [address], [place], [place] [address]    Report Status PENDING  Incomplete  Culture, blood (Routine X 2) w Reflex to ID Panel     Status: None (Preliminary result)   Collection Time: 08/28/24  7:09 PM   Specimen: BLOOD LEFT HAND  Result Value Ref Range Status   Specimen Description BLOOD LEFT HAND  Final   Special Requests   Final    BOTTLES DRAWN AEROBIC AND ANAEROBIC Blood Culture adequate volume   Culture  Setup Time NO ORGANISMS SEEN ANAEROBIC BOTTLE ONLY   Final   Culture   Final    NO GROWTH 4 DAYS Performed at [facility], [address] [place], [place] [address]    Report Status PENDING  Incomplete  Surgical pcr screen     Status: None   Collection Time: 08/29/24 12:40 AM   Specimen: Nasal Mucosa; Nasal Swab  Result Value Ref Range Status   MRSA, PCR NEGATIVE NEGATIVE Final   Staphylococcus aureus NEGATIVE NEGATIVE Final     Comment: (NOTE) The Xpert SA Assay (FDA approved for NASAL specimens in patients 34 years of age and older), is one component of a comprehensive surveillance program. It is not intended to diagnose infection nor to guide or monitor treatment. Performed at [facility], [address] [place], [place] [address]    Radiology Studies: No results found.  Scheduled Meds:  Chlorhexidine Gluconate Cloth  6 each Topical Daily   divalproex  125 mg Oral QPM   donepezil   2.5 mg Oral QODAY   escitalopram  15 mg Oral Daily   feeding supplement  237 mL Oral BID BM   memantine   10 mg Oral BID   [START ON 09/02/2024] multivitamin with minerals  1 tablet Oral Daily   polyethylene glycol  17 g Oral BID   potassium chloride   40 mEq Oral BID   senna-docusate  1 tablet Oral BID   Continuous Infusions:   LOS: 4 days   Alejandro Marker, DO [facility] Available via Epic secure chat 7am-7pm After these hours, please refer to coverage provider listed on [email or web address] 09/01/2024, 4:00 PM

## 2024-09-02 ENCOUNTER — Encounter (HOSPITAL_COMMUNITY): Payer: Self-pay | Admitting: Orthopaedic Surgery

## 2024-09-02 DIAGNOSIS — F03918 Unspecified dementia, unspecified severity, with other behavioral disturbance: Secondary | ICD-10-CM | POA: Diagnosis not present

## 2024-09-02 DIAGNOSIS — E43 Unspecified severe protein-calorie malnutrition: Secondary | ICD-10-CM | POA: Diagnosis not present

## 2024-09-02 DIAGNOSIS — S7291XA Unspecified fracture of right femur, initial encounter for closed fracture: Secondary | ICD-10-CM | POA: Diagnosis not present

## 2024-09-02 LAB — CULTURE, BLOOD (ROUTINE X 2)
Culture  Setup Time: NONE SEEN
Culture: NO GROWTH
Culture: NO GROWTH
Special Requests: ADEQUATE

## 2024-09-02 LAB — VITAMIN D 25 HYDROXY (VIT D DEFICIENCY, FRACTURES): Vit D, 25-Hydroxy: 27.29 ng/mL — ABNORMAL LOW (ref 30–100)

## 2024-09-02 MED ORDER — ENSURE PLUS HIGH PROTEIN PO LIQD: 237.0000 mL | Freq: Two times a day (BID) | ORAL | Status: AC

## 2024-09-02 MED ORDER — POLYETHYLENE GLYCOL 3350 17 G PO PACK: 17.0000 g | PACK | Freq: Every day | ORAL | Status: AC

## 2024-09-02 MED ORDER — ONDANSETRON HCL 4 MG PO TABS: 4.0000 mg | ORAL_TABLET | Freq: Four times a day (QID) | ORAL | Status: AC | PRN

## 2024-09-02 MED ORDER — ALPRAZOLAM 0.25 MG PO TABS: 0.2500 mg | ORAL_TABLET | Freq: Two times a day (BID) | ORAL | 0 refills | Status: AC | PRN

## 2024-09-02 MED ORDER — LORAZEPAM 1 MG PO TABS
1.0000 mg | ORAL_TABLET | Freq: Four times a day (QID) | ORAL | Status: DC | PRN
  Administered 2024-09-02: 1 mg via ORAL
  Filled 2024-09-02: qty 1

## 2024-09-02 MED ORDER — CHOLECALCIFEROL 25 MCG (1000 UT) PO TABS: 2000.0000 [IU] | ORAL_TABLET | Freq: Every day | ORAL | Status: AC

## 2024-09-02 MED ORDER — SENNOSIDES-DOCUSATE SODIUM 8.6-50 MG PO TABS: 1.0000 | ORAL_TABLET | Freq: Every evening | ORAL | Status: AC | PRN

## 2024-09-02 NOTE — Plan of Care (Signed)

## 2024-09-02 NOTE — TOC Progression Note (Addendum)
 Transition of Care [facility]) - Progression Note    Patient Details  Name: Cheryl Olson MRN: [id number] Date of Birth: 01/13/1941  Transition of [facility]) CM/SW Contact  Bridget Cordella Simmonds, LCSW Phone Number: 09/02/2024, 8:59 AM  Clinical Narrative:   SNF auth request remains pending in [employer].  1145: SNF auth approved: [id number], 3 days: 11/12-11/14.  CSW confirmed with [facility] that they can receive pt today.  MD informed.   Expected Discharge Plan: Skilled Nursing Facility Barriers to Discharge: Continued Medical Work up, SNF Pending bed offer               Expected Discharge Plan and Services In-house Referral: Clinical Social Work   Post Acute Care Choice: Skilled Nursing Facility Living arrangements for the past 2 months: Assisted Living Facility [facility])                                       Social Drivers of Health (SDOH) Interventions SDOH Screenings   Food Insecurity: No Food Insecurity (08/28/2024)  Housing: Low Risk  (08/28/2024)  Transportation Needs: No Transportation Needs (08/28/2024)  Utilities: Not At Risk (08/28/2024)  Social Connections: Socially Isolated (08/28/2024)  Tobacco Use: Medium Risk (08/29/2024)    Readmission Risk Interventions     No data to display

## 2024-09-02 NOTE — TOC Transition Note (Addendum)
 Transition of Care [facility]) - Discharge Note   Patient Details  Name: Cheryl Olson MRN: [id number] Date of Birth: 02/01/1941  Transition of Care [facility]) CM/SW Contact:  Bridget Cordella Simmonds, LCSW Phone Number: 09/02/2024, 12:33 PM   Clinical Narrative:   Pt discharging to [facility].  RN call report to [phone].   PTAR called 1300.  Final next level of care: Skilled Nursing Facility Barriers to Discharge: Barriers Resolved   Patient Goals and CMS Choice   CMS Medicare.gov Compare Post Acute Care list provided to:: Patient Represenative (must comment) (daughter [name]) Choice offered to / list presented to : Adult Children      Discharge Placement              Patient chooses bed at: [facility] Patient to be transferred to facility by: ptar Name of family member notified: son [name] in room Patient and family notified of of transfer: 09/02/24  Discharge Plan and Services Additional resources added to the After Visit Summary for   In-house Referral: Clinical Social Work   Post Acute Care Choice: Skilled Nursing Facility                               Social Drivers of Health (SDOH) Interventions SDOH Screenings   Food Insecurity: No Food Insecurity (08/28/2024)  Housing: Low Risk  (08/28/2024)  Transportation Needs: No Transportation Needs (08/28/2024)  Utilities: Not At Risk (08/28/2024)  Social Connections: Socially Isolated (08/28/2024)  Tobacco Use: Medium Risk (08/29/2024)     Readmission Risk Interventions     No data to display

## 2024-09-02 NOTE — Discharge Summary (Signed)
 Physician Discharge Summary   Patient: Cheryl Olson MRN: [id number] DOB: 12/04/1940  Admit date:     08/28/2024  Discharge date: 09/02/24  Discharge Physician: Alejandro Marker, DO   PCP: Larnell Hamilton, MD   Recommendations at discharge:   Follow-up with PCP in 1 to 2 weeks and repeat CBC, CMP, mag, Phos within 1 week Follow-up with orthopedic surgery Dr. Timmy within 1 to 2 weeks  Discharge Diagnoses: Principal Problem:   Femur fracture, right (HCC) Active Problems:   Protein-calorie malnutrition, severe  Resolved Problems:   * No resolved hospital problems. *  Hospital Course: Cheryl Olson is a 83 y.o. female with a history of dementia and COPD.  Patient presented secondary to being found down after an unwitnessed fall and found to have a right femur fracture. Syncope workup started and orthopedic surgery consulted for management of fracture.  She underwent IM nailing of the right distal femur fracture which was retrograde and she is postoperative day 2.  Subsequently she was found to have acute blood loss anemia and she was typed and screened and transfused 2 units of PRBCs.    Hemoglobin is improved along with several other labs however she continues to have behavioral disturbances and had to have a waist restraint placed on her overnight.  Will remove and continue to monitor as PT/OT recommending SNF. She is improved and medically stable to D/C to SNF and need to follow-up with PCP and orthopedic surgery within 1 to 2 weeks.  Assessment and Plan:  Possible Syncope: Unwitnessed fall. Complicated by underlying dementia as patient cannot give a history, so syncope could not be ruled out. Transthoracic Echocardiogram ordered and is with LVEF >70% and no aortic stenosis. EEG ordered and declined per family. Head CT done and showed No acute intracranial abnormality. Mild to moderate chronic small vessel ischemic disease. Cervical CT spine w/o Contrast showed no acute cervical  spine fracture or traumatic malalignment and Advanced disc and facet degeneration.. -UDS ordered but not completed. Alcohol level undetectable. Blood Cx x2 @ 5 Days. Orthostatics deferred secondary to leg fracture. Per family, patient has a history of vasovagal syncope, which could be the etiology. Continue Telemetry Monitoring. PT/OT eval reccomending SNF   Right Femur Fracture: Secondary to unwitnessed fall. Orthopedic surgery consulted with plan for surgical management 11/8. Underwent IM nailing of the R Distal Femur Fx. Ortho recommending TDWB on the Right Leg. C/w Enoxaparin  40 mg Daily for DVT Ppx and then transition to 325 aspirin daily for 30 days at discharge. C/w Pain Control with IV Morphine 1 mg q4hprn.  Vitamin D was 27.29 continue supportive care with ondansetron  4 mg p.o./IV every 6 as needed for nausea.  Will add a bowel regimen with senna docusate 1 tab p.o. twice daily (discontinuing the Docusate 100 mg po BID) and MiraLAX 17 g p.o. twice daily.  She will need to follow-up with orthopedic surgery Dr. Elsa for staple removal and repeat radiographs of the operative femur in 2 weeks   COPD: Continue Duonebs as needed 3 mL q6hprn Wheezing. CTM Respiratory Status carefully   Dementia: Noted. Patient lives at a memory care facility. C/w Donepezil  2.5 mg po EOD, Escitalopram 15 mg po Daily, Divalproex 125 mg po Daily, Memantine  10 mg po BID. Delirium Precautions and continue trazodone 50 mg p.o. nightly as needed sleep, mood/anxiety disorder; continue with 2 mg of IV Haldol every 4 as needed agitation but will now start IV Lorazepam  1 mg q6hprn Anxiety; Given her behavioral  disturbances she was placed in a soft waist restraints but these were removed 11/10; Will need to remain out of restraints to go to SNF 11/12 and this was done and she is medically stable for discharge at this time  Hypokalemia: K+ is 3.3. Replete w/ po Kcl 40 mEQ BID x2. Mag Level was 1.8 so that was replete as well. CTM and  Replete as Necessary & repeat CMP within 1 week  CKD Stage 3a / Elevated Anion Gap: Baseline Cr ranging from 0.9-1.1. BUN/Cr Trend improved and last check was 17/0.82.  She has a slightly elevated anion gap of 16.  Avoid Nephrotoxic Medications, Contrast Dyes, Hypotension and Dehydration to Ensure Adequate Renal Perfusion and will need to Renally Adjust Meds; CTM & Trend Renal Function carefully & repeat CMP within 1 week  Abnormal LFTs: Likely reactive and Improving; AST went from 37 -> 80 -> 61 -> 49 and ALT went from 15 -> 62 -> 28 -> 23 on the last check. CTM and Trend Hepatic Fxn Panel; if not further improving or worsening will obtain a right upper quadrant ultrasound and acute hepatitis panel but since it is stabilized she can have outpatient follow-up and repeat blood work when  ABLA superimposed on Chronic Normocytic Anemia: Stablized Recent Labs  Lab 08/28/24 0831 08/28/24 0842 08/28/24 1440 08/29/24 1404 08/30/24 0944 08/31/24 0613 09/01/24 0334  HGB 11.7* 12.6 10.2* 7.7* 5.6* 9.1* 9.6*  HCT 35.8* 37.0 31.8* 24.3* 16.7* 26.8* 28.2*  MCV 97.5  --  98.1 99.6 95.4 90.5 91.0  -Type and Screen and Transfuse 2 units of pRBCs. CTM for S/Sx of bleeding; no overt bleeding noted. Repeat CBC in the AM  Thrombocytopenia: Improved. In the setting of Acute Blood loss from above. Plt Count went from 182 -> 162 -> 164 -> 125 -> 138 -> 175 on last check. CTM for S/Sx of Bleeding; no overt bleeding noted. Repeat CBC in the AM   Hyperbilirubinemia: Likely Reactive. T Bili went from 0.7 -> 1.3 -> 1.4 on last check. (Received 2 units of pRBC's the day before yesterday). CTM and Trend and repeat CMP in the AM   Hypoalbuminemia: Patient's Albumin Lvl went from 3.7 -> 2.8 -> 2.7 -> 3.0 on last check. CTM & Trend & repeat CMP in the AM  Severe Malnutrition in the Context of Social Environmental Circumstances: Nutrition Status: Nutrition Problem: Severe Malnutrition Etiology: social / environmental  circumstances Signs/Symptoms: moderate fat depletion, severe muscle depletion Interventions: Ensure Enlive (each supplement provides 350kcal and 20 grams of protein), MVI Nutrition Documentation    Flowsheet Row ED to Hosp-Admission (Current) from 08/28/2024 in [facility]  Nutrition Problem Severe Malnutrition  Etiology social / environmental circumstances  Nutrition Goal Patient will meet greater than or equal to 90% of their needs  Interventions Ensure Enlive (each supplement provides 350kcal and 20 grams of protein), MVI   Consultants: Orthopedic surgery Procedures performed: As delineated as above  Disposition: Skilled nursing facility  Diet recommendation:  Regular diet  DISCHARGE MEDICATION: Allergies as of 09/02/2024       Reactions   Walnut Itching, Rash   Other    Needle phobia        Medication List     STOP taking these medications    nitrofurantoin (macrocrystal-monohydrate) 100 MG capsule Commonly known as: MACROBID       TAKE these medications    albuterol  108 (90 Base) MCG/ACT inhaler Commonly known as: VENTOLIN  HFA Inhale 1  puff into the lungs every 4 (four) hours as needed for wheezing or shortness of breath.   ALPRAZolam 0.25 MG tablet Commonly known as: XANAX Take 1 tablet (0.25 mg total) by mouth 2 (two) times daily as needed for anxiety.   Cholecalciferol 25 MCG (1000 UT) tablet Take 2 tablets (2,000 Units total) by mouth daily.   divalproex 125 MG DR tablet Commonly known as: DEPAKOTE Take 125 mg by mouth 2 (two) times daily.   donepezil  5 MG tablet Commonly known as: ARICEPT  Take 2.5 mg by mouth See admin instructions. Take 2.5 mg every other day at bedtime.   escitalopram 10 MG tablet Commonly known as: LEXAPRO Take 15 mg by mouth daily.   feeding supplement Liqd Take 237 mLs by mouth 2 (two) times daily between meals.   fluticasone -salmeterol 100-50 MCG/ACT Aepb Commonly known as:  ADVAIR  Inhale 1 puff into the lungs 2 (two) times daily as needed (wheezing/sob).   memantine  10 MG tablet Commonly known as: Namenda  Take 1 tablet (10 mg total) by mouth 2 (two) times daily.   multivitamin with minerals tablet Take 1 tablet by mouth daily.   ondansetron  4 MG tablet Commonly known as: ZOFRAN  Take 1 tablet (4 mg total) by mouth every 6 (six) hours as needed for nausea.   polyethylene glycol 17 g packet Commonly known as: MIRALAX / GLYCOLAX Take 17 g by mouth daily.   senna-docusate 8.6-50 MG tablet Commonly known as: Senokot-S Take 1 tablet by mouth at bedtime as needed for mild constipation.   traZODone 50 MG tablet Commonly known as: DESYREL Take 50 mg by mouth at bedtime.               Discharge Care Instructions  (From admission, onward)           Start     Ordered   09/02/24 0000  Discharge wound care:       Comments: Reinforce dressing until discontinued by Orthopedic Surgery   09/02/24 1214            Follow-up Information     Elsa Lonni SAUNDERS, MD Follow up in 2 week(s).   Specialty: Orthopedic Surgery Contact information: [address] [place] [address] [phone]                Discharge Exam: Fredricka Weights   08/28/24 0814 09/01/24 1300  Weight: 59 kg 50.6 kg   Vitals:   09/02/24 0310 09/02/24 0804  BP: (!) 165/104 (!) 105/52  Pulse: 68 72  Resp: 16 17  Temp: 98.4 F (36.9 C) 98.3 F (36.8 C)  SpO2: 92% 95%   Examination: Physical Exam:  Constitutional: Extremely thin and frail elderly chronically ill-appearing Caucasian female who is pleasantly demented and resting Respiratory: Diminished to auscultation bilaterally, no wheezing, rales, rhonchi or crackles. Normal respiratory effort and patient is not tachypenic. No accessory muscle use.  Unlabored breathing Cardiovascular: RRR, no murmurs / rubs / gallops. S1 and S2 auscultated. No extremity edema.  Abdomen: Soft, non-tender, non-distended.  Bowel sounds positive.  GU: Deferred. Musculoskeletal: No clubbing / cyanosis of digits/nails. No joint deformity upper and lower extremities.  Skin: Right leg is wrapped Neurologic: Moves extremities independently.  Appears little somnolent and drowsy but easily arousable Psychiatric: Impaired judgment insight into pleasantly demented and confused and did not really talk to me during the encounter but nodded yes and no  Condition at discharge: stable  The results of significant diagnostics from this hospitalization (including imaging, microbiology, ancillary  and laboratory) are listed below for reference.   Imaging Studies: DG FEMUR, MIN 2 VIEWS RIGHT Result Date: 08/29/2024 CLINICAL DATA:  Elective surgery. EXAM: RIGHT FEMUR 2 VIEWS COMPARISON:  Radiographs yesterday FINDINGS: Eleven fluoroscopic spot views of the right femur submitted from the operating room. Femoral intramedullary nail with proximal and distal locking screw fixation traverse distal femur fracture. Improved fracture alignment from preoperative imaging. Fluoroscopy time 2 minutes 2 seconds. Dose 4.3 mGy. IMPRESSION: Intraoperative fluoroscopy during ORIF of distal femur fracture. Electronically Signed   By: Andrea Gasman M.D.   On: 08/29/2024 12:42   DG C-Arm 1-60 Min-No Report Result Date: 08/29/2024 Fluoroscopy was utilized by the requesting physician.  No radiographic interpretation.   DG C-Arm 1-60 Min-No Report Result Date: 08/29/2024 Fluoroscopy was utilized by the requesting physician.  No radiographic interpretation.   ECHOCARDIOGRAM COMPLETE BUBBLE STUDY Result Date: 08/28/2024    ECHOCARDIOGRAM REPORT   Patient Name:   Flavia NOEL Motl Date of Exam: 08/28/2024 Medical Rec #:  [id number]         Height:       61.0 in Accession #:    [id number]        Weight:       130.0 lb Date of Birth:  Jul 18, 1941         BSA:          1.573 m Patient Age:    82 years          BP:           168/57 mmHg Patient Gender: F                  HR:           65 bpm. Exam Location:  Inpatient Procedure: 2D Echo, Cardiac Doppler, Color Doppler and Saline Contrast Bubble            Study (Both Spectral and Color Flow Doppler were utilized during            procedure). Indications:    Syncope 780.2/R55  History:        Patient has no prior history of Echocardiogram examinations.                 COPD, Signs/Symptoms:Syncope and Dizziness/Lightheadedness; Risk                 Factors:Hypertension.  Sonographer:    Koleen Popper RDCS Referring Phys: [id number] MARSHA ADA  Sonographer Comments: Image acquisition challenging due to patient body habitus. IMPRESSIONS  1. Left ventricular ejection fraction, by estimation, is >75%. The left ventricle has hyperdynamic function. The left ventricle has no regional wall motion abnormalities. Left ventricular diastolic parameters were normal.  2. Right ventricular systolic function is normal. The right ventricular size is normal. Mildly increased right ventricular wall thickness. There is normal pulmonary artery systolic pressure. The estimated right ventricular systolic pressure is 31.1 mmHg.  3. The mitral valve is grossly normal. No evidence of mitral valve regurgitation. No evidence of mitral stenosis.  4. The aortic valve is tricuspid. Aortic valve regurgitation is mild. Aortic valve sclerosis is present, with no evidence of aortic valve stenosis.  5. The inferior vena cava is normal in size with greater than 50% respiratory variability, suggesting right atrial pressure of 3 mmHg.  6. Agitated saline contrast bubble study was negative, with no evidence of any interatrial shunt. Comparison(s): No prior Echocardiogram. FINDINGS  Left Ventricle: Left ventricular ejection fraction, by estimation, is >75%. The  left ventricle has hyperdynamic function. The left ventricle has no regional wall motion abnormalities. The left ventricular internal cavity size was normal in size. There is no left ventricular hypertrophy of  the basal-septal segment. Left ventricular diastolic parameters were normal. Right Ventricle: The right ventricular size is normal. Mildly increased right ventricular wall thickness. Right ventricular systolic function is normal. There is normal pulmonary artery systolic pressure. The tricuspid regurgitant velocity is 2.65 m/s, and with an assumed right atrial pressure of 3 mmHg, the estimated right ventricular systolic pressure is 31.1 mmHg. Left Atrium: Left atrial size was normal in size. Right Atrium: Right atrial size was normal in size. Pericardium: There is no evidence of pericardial effusion. Mitral Valve: The mitral valve is grossly normal. No evidence of mitral valve regurgitation. No evidence of mitral valve stenosis. Tricuspid Valve: The tricuspid valve is normal in structure. Tricuspid valve regurgitation is mild . No evidence of tricuspid stenosis. Aortic Valve: The aortic valve is tricuspid. Aortic valve regurgitation is mild. Aortic valve sclerosis is present, with no evidence of aortic valve stenosis. Pulmonic Valve: The pulmonic valve was normal in structure. Pulmonic valve regurgitation is mild. No evidence of pulmonic stenosis. Aorta: The aortic root and ascending aorta are structurally normal, with no evidence of dilitation. Venous: The inferior vena cava is normal in size with greater than 50% respiratory variability, suggesting right atrial pressure of 3 mmHg. IAS/Shunts: No atrial level shunt detected by color flow Doppler. Agitated saline contrast was given intravenously to evaluate for intracardiac shunting. Agitated saline contrast bubble study was negative, with no evidence of any interatrial shunt.  LEFT VENTRICLE PLAX 2D LVIDd:         4.00 cm   Diastology LVIDs:         2.10 cm   LV e' medial:    7.62 cm/s LV PW:         1.10 cm   LV E/e' medial:  10.5 LV IVS:        1.30 cm   LV e' lateral:   6.40 cm/s LVOT diam:     1.83 cm   LV E/e' lateral: 12.5 LV SV:         57 LV SV Index:   36  LVOT Area:     2.63 cm  RIGHT VENTRICLE             IVC RV S prime:     16.50 cm/s  IVC diam: 1.58 cm TAPSE (M-mode): 1.8 cm LEFT ATRIUM             Index LA diam:        3.18 cm 2.02 cm/m LA Vol (A2C):   41.5 ml 26.39 ml/m LA Vol (A4C):   41.1 ml 26.13 ml/m LA Biplane Vol: 41.8 ml 26.58 ml/m  AORTIC VALVE LVOT Vmax:   102.00 cm/s LVOT Vmean:  68.700 cm/s LVOT VTI:    0.216 m  AORTA Ao Root diam: 3.01 cm Ao Asc diam:  3.32 cm MITRAL VALVE               TRICUSPID VALVE MV Area (PHT): 3.37 cm    TR Peak grad:   28.1 mmHg MV Decel Time: 225 msec    TR Vmax:        265.00 cm/s MV E velocity: 80.20 cm/s MV A velocity: 89.60 cm/s  SHUNTS MV E/A ratio:  0.90        Systemic VTI:  0.22 m  Systemic Diam: 1.83 cm Sunit Tolia Electronically signed by Madonna Large Signature Date/Time: 08/28/2024/8:50:43 PM    Final    CT FEMUR RIGHT WO CONTRAST Result Date: 08/28/2024 EXAM: CT OF THE RIGHT FEMUR, WITHOUT IV CONTRAST 08/28/2024 12:43:30 PM TECHNIQUE: Axial images were acquired through the right femur without IV contrast. Reformatted images were reviewed. Automated exposure control, iterative reconstruction, and/or weight based adjustment of the mA/kV was utilized to reduce the radiation dose to as low as reasonably achievable. COMPARISON: 08/28/2024 CLINICAL HISTORY: Femur fracture. FINDINGS: BONES: Comminuted spiral fracture of the distal femoral metadiaphysis including a 9.6 cm in length medial intermediary fragment which is mildly medially displaced. The proximal fragment overlaps the distal fragment by about 4.4 cm. Deformity of the pubic rami compatible with old fractures. Remote left sacral fracture. 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 Moderate osteoarthritis of the right knee. Small knee joint effusion. No dislocation. SOFT TISSUES: Small amount of edema or hematoma along the upper popliteal region. Expected indistinctness of fascial planes around the fracture site.  Atheromatous vascular calcifications. IMPRESSION: 1. Comminuted spiral fracture of the distal femoral metadiaphysis with medial intermediary fragment, mild medial displacement, and 4.4 cm fragment overlap. 2. 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 3. Moderate right knee osteoarthritis. 4. Deformities of the pubic rami consistent with old fractures and remote left sacral fracture. Electronically signed by: Ryan Salvage MD 08/28/2024 12:50 PM EST RP Workstation: [id number]   DG Chest 1 View Result Date: 08/28/2024 EXAM: 1 VIEW(S) XRAY OF THE CHEST 08/28/2024 09:45:00 AM COMPARISON: 05/23/2023 CLINICAL HISTORY: [id number] Fall [id number] FINDINGS: LUNGS AND PLEURA: Mild streaky bibasilar scarring versus atelectasis. Chronic elevation of the right hemidiaphragm. No focal pulmonary opacity. No pulmonary edema. No pleural effusion. No pneumothorax. HEART AND MEDIASTINUM: Atherosclerotic plaque noted. No acute abnormality of the cardiac and mediastinal silhouettes. BONES AND SOFT TISSUES: No acute osseous abnormality. IMPRESSION: 1. Mild bibasilar atelectasis or scarring. 2. Chronic elevation of the right hemidiaphragm. 3. Aortic Atherosclerosis (ICD10-I70.0). Electronically signed by: Selinda Blue MD 08/28/2024 10:21 AM EST RP Workstation: [id number]   CT Cervical Spine Wo Contrast Result Date: 08/28/2024 EXAM: CT CERVICAL SPINE WITHOUT CONTRAST 08/28/2024 10:08:51 AM TECHNIQUE: CT of the cervical spine was performed without the administration of intravenous contrast. Multiplanar reformatted images are provided for review. Automated exposure control, iterative reconstruction, and/or weight based adjustment of the mA/kV was utilized to reduce the radiation dose to as low as reasonably achievable. COMPARISON: CT cervical spine 11/13/2023. CLINICAL HISTORY: Polytrauma, blunt. FINDINGS: CERVICAL SPINE: BONES AND ALIGNMENT: Chronic straightening/mild reversal of the normal cervical lordosis with grade 1  anterolisthesis of C3 on C4 and C4 on C5. No acute fracture or suspicious lesion. DEGENERATIVE CHANGES: Moderate C1-C2 degenerative changes. Widespread, advanced cervical and upper thoracic facet arthrosis. Posterior element ankylosis at C2-C3. Advanced lower cervical disc degeneration. No suspected high grade spinal canal stenosis. At least moderate multilevel neural foraminal stenosis. SOFT TISSUES: No prevertebral soft tissue swelling. IMPRESSION: 1. No acute cervical spine fracture or traumatic malalignment. 2. Advanced disc and facet degeneration. Electronically signed by: Dasie Hamburg MD 08/28/2024 10:20 AM EST RP Workstation: [id number]   DG FEMUR, MIN 2 VIEWS RIGHT Result Date: 08/28/2024 EXAM: 2 VIEW(S) XRAY OF THE RIGHT FEMUR 08/28/2024 09:46:00 AM COMPARISON: None available. CLINICAL HISTORY: Pain FINDINGS: BONES AND JOINTS: Acute comminuted spiral fracture of the distal right femoral diaphysis, with large 13 x 2 cm butterfly fragment medially, with 3 cm overriding, with 17 mm lateral displacement of the dominant distal fracture fragment, and  mild apex lateral angulation. No joint dislocation. SOFT TISSUES: The soft tissues are unremarkable. IMPRESSION: 1. Acute comminuted spiral fracture of the distal right femoral diaphysis with a large 13 x 2 cm medial butterfly fragment, 3 cm overriding, 17 mm lateral displacement of the distal fragment, and mild apex lateral angulation. Electronically signed by: Selinda Blue MD 08/28/2024 10:17 AM EST RP Workstation: [id number]   CT Head Wo Contrast Result Date: 08/28/2024 EXAM: CT HEAD WITHOUT CONTRAST 08/28/2024 10:08:51 AM TECHNIQUE: CT of the head was performed without the administration of intravenous contrast. Automated exposure control, iterative reconstruction, and/or weight based adjustment of the mA/kV was utilized to reduce the radiation dose to as low as reasonably achievable. COMPARISON: Head CT 11/13/2023. CLINICAL HISTORY: Head trauma, minor (Age >=  65y). FINDINGS: BRAIN AND VENTRICLES: There is no evidence of an acute infarct, intracranial hemorrhage, mass, midline shift, hydrocephalus, or extra-axial fluid collection. Patchy cerebral white matter hypodensities are similar to the prior study and nonspecific but compatible with mild to moderate chronic small vessel ischemic disease. Cerebral atrophy is unchanged with prominent bilateral mesial temporal lobe volume loss again noted. Calcified atherosclerosis at the skull base. ORBITS: Bilateral cataract extraction. SINUSES: No acute abnormality. SOFT TISSUES AND SKULL: No acute soft tissue abnormality. No skull fracture. IMPRESSION: 1. No acute intracranial abnormality. 2. Mild to moderate chronic small vessel ischemic disease. Electronically signed by: Dasie Hamburg MD 08/28/2024 10:17 AM EST RP Workstation: [id number]   DG Pelvis 1-2 Views Result Date: 08/28/2024 EXAM: 1 or 2 VIEW(S) XRAY OF THE PELVIS 08/28/2024 09:45:00 AM COMPARISON: Unenhanced CT abdomen/pelvis dated 11/14/2017. CLINICAL HISTORY: [id number] Trauma [id number] FINDINGS: BONES AND JOINTS: Healed deformities of the superior and inferior pubic rami bilaterally, unchanged since the 2019 CT. Degenerative changes of the lower lumbar spine. Mild degenerative changes of the RIGHT hip. No acute fracture or diastasis. No joint dislocation. SOFT TISSUES: The soft tissues are unremarkable. IMPRESSION: 1. No acute osseous abnormality in the pelvis. 2. Degenerative changes of the lower lumbar spine. 3. Mild degenerative changes of the right hip. Electronically signed by: Selinda Blue MD 08/28/2024 10:10 AM EST RP Workstation: [id number]   Microbiology: Results for orders placed or performed during the hospital encounter of 08/28/24  Culture, blood (Routine X 2) w Reflex to ID Panel     Status: None   Collection Time: 08/28/24  7:04 PM   Specimen: BLOOD LEFT ARM  Result Value Ref Range Status   Specimen Description BLOOD LEFT ARM  Final   Special Requests    Final    BOTTLES DRAWN AEROBIC AND ANAEROBIC Blood Culture results may not be optimal due to an inadequate volume of blood received in culture bottles   Culture   Final    NO GROWTH 5 DAYS Performed at [facility], [address], [place], [place] [address]    Report Status 09/02/2024 FINAL  Final  Culture, blood (Routine X 2) w Reflex to ID Panel     Status: None   Collection Time: 08/28/24  7:09 PM   Specimen: BLOOD LEFT HAND  Result Value Ref Range Status   Specimen Description BLOOD LEFT HAND  Final   Special Requests   Final    BOTTLES DRAWN AEROBIC AND ANAEROBIC Blood Culture adequate volume   Culture  Setup Time NO ORGANISMS SEEN ANAEROBIC BOTTLE ONLY   Final   Culture   Final    NO GROWTH 5 DAYS Performed at [facility], [address], [place], [place]  [address]    Report Status 09/02/2024 FINAL  Final  Surgical pcr screen     Status: None   Collection Time: 08/29/24 12:40 AM   Specimen: Nasal Mucosa; Nasal Swab  Result Value Ref Range Status   MRSA, PCR NEGATIVE NEGATIVE Final   Staphylococcus aureus NEGATIVE NEGATIVE Final    Comment: (NOTE) The Xpert SA Assay (FDA approved for NASAL specimens in patients 21 years of age and older), is one component of a comprehensive surveillance program. It is not intended to diagnose infection nor to guide or monitor treatment. Performed at [facility], [address] [place], [place] [address]    Labs: CBC: Recent Labs  Lab 08/28/24 0831 08/28/24 9157 08/28/24 1440 08/29/24 1404 08/30/24 0944 08/31/24 0613 09/01/24 0334  WBC 8.1  --  6.6 9.4 8.3 8.7 7.3  NEUTROABS 6.6  --   --   --  6.1 6.3 5.6  HGB 11.7*   < > 10.2* 7.7* 5.6* 9.1* 9.6*  HCT 35.8*   < > 31.8* 24.3* 16.7* 26.8* 28.2*  MCV 97.5  --  98.1 99.6 95.4 90.5 91.0  PLT 182  --  162 164 125* 138* 175   < > = values in this interval not displayed.   Basic Metabolic Panel: Recent Labs  Lab 08/28/24 0831 08/28/24 0842 08/28/24 1440  08/29/24 1404 08/30/24 0944 08/31/24 0613 09/01/24 0334  NA 138 138  --   --  135 139 135  K 3.6 3.5  --   --  3.6 3.9 3.3*  CL 101 98  --   --  101 99 94*  CO2 25  --   --   --  24 30 25   GLUCOSE 101* 99  --   --  119* 108* 99  BUN 13 17  --   --  28* 24* 17  CREATININE 1.09* 1.00 0.98 1.02* 1.19* 0.94 0.82  CALCIUM 8.9  --   --   --  8.0* 8.5* 8.5*  MG  --   --   --   --  1.9 1.9 1.8  PHOS  --   --   --   --  3.3 3.0 2.5   Liver Function Tests: Recent Labs  Lab 08/28/24 0831 08/30/24 0944 08/31/24 0613 09/01/24 0334  AST 37 80* 61* 49*  ALT 15 62* 28 23  ALKPHOS 59 62 64 62  BILITOT 0.9 0.7 1.3* 1.4*  PROT 6.2* 4.8* 5.5* 5.7*  ALBUMIN 3.7 2.8* 2.7* 3.0*   CBG: No results for input(s): GLUCAP in the last 168 hours.  Discharge time spent: greater than 30 minutes.  Signed: Alejandro Marker, DO [facility] 09/02/2024

## 2024-09-02 NOTE — Care Management Important Message (Signed)
 Important Message  Patient Details  Name: Cheryl Olson MRN: [id number] Date of Birth: [birth year]   Important Message Given:  Yes - Medicare IM     [name], LCSW 09/02/2024, 9:41 AM

## 2024-09-02 NOTE — Plan of Care (Signed)
 IVs removed. AVS placed in discharge packet and a copy given to son at bedside. Will continue to monitor patient.   Problem: Education: Goal: Knowledge of General Education information will improve Description: Including pain rating scale, medication(s)/side effects and non-pharmacologic comfort measures 09/02/2024 1303 by Francia Tinnie BROCKS, RN Outcome: Adequate for Discharge 09/02/2024 0905 by Francia Tinnie BROCKS, RN Outcome: Progressing   Problem: Health Behavior/Discharge Planning: Goal: Ability to manage health-related needs will improve Outcome: Adequate for Discharge   Problem: Clinical Measurements: Goal: Ability to maintain clinical measurements within normal limits will improve Outcome: Adequate for Discharge Goal: Will remain free from infection Outcome: Adequate for Discharge Goal: Diagnostic test results will improve Outcome: Adequate for Discharge Goal: Respiratory complications will improve Outcome: Adequate for Discharge Goal: Cardiovascular complication will be avoided Outcome: Adequate for Discharge   Problem: Activity: Goal: Risk for activity intolerance will decrease 09/02/2024 1303 by Francia Tinnie BROCKS, RN Outcome: Adequate for Discharge 09/02/2024 0905 by Francia Tinnie BROCKS, RN Outcome: Progressing   Problem: Nutrition: Goal: Adequate nutrition will be maintained Outcome: Adequate for Discharge   Problem: Coping: Goal: Level of anxiety will decrease Outcome: Adequate for Discharge   Problem: Elimination: Goal: Will not experience complications related to bowel motility Outcome: Adequate for Discharge Goal: Will not experience complications related to urinary retention Outcome: Adequate for Discharge   Problem: Pain Managment: Goal: General experience of comfort will improve and/or be controlled 09/02/2024 1303 by Francia Tinnie BROCKS, RN Outcome: Adequate for Discharge 09/02/2024 0905 by Francia Tinnie BROCKS, RN Outcome: Progressing   Problem:  Safety: Goal: Ability to remain free from injury will improve 09/02/2024 1303 by Francia Tinnie BROCKS, RN Outcome: Adequate for Discharge 09/02/2024 0905 by Francia Tinnie BROCKS, RN Outcome: Progressing   Problem: Skin Integrity: Goal: Risk for impaired skin integrity will decrease 09/02/2024 1303 by Francia Tinnie BROCKS, RN Outcome: Adequate for Discharge 09/02/2024 0905 by Francia Tinnie BROCKS, RN Outcome: Progressing   Problem: Safety: Goal: Non-violent Restraint(s) Outcome: Adequate for Discharge   Problem: Acute Rehab OT Goals (only OT should resolve) Goal: Pt. Will Perform Grooming Outcome: Adequate for Discharge Goal: Pt. Will Perform Upper Body Bathing Outcome: Adequate for Discharge Goal: Pt. Will Perform Upper Body Dressing Outcome: Adequate for Discharge Goal: Pt. Will Transfer To Toilet Outcome: Adequate for Discharge Goal: Pt. Will Perform Toileting-Clothing Manipulation Outcome: Adequate for Discharge Goal: OT Additional ADL Goal #1 Outcome: Adequate for Discharge   Problem: Acute Rehab PT Goals(only PT should resolve) Goal: Pt will Roll Supine to Side Outcome: Adequate for Discharge Goal: Pt Will Go Supine/Side To Sit Outcome: Adequate for Discharge Goal: Pt Will Go Sit To Supine/Side Outcome: Adequate for Discharge Goal: Patient Will Transfer Sit To/From Stand Outcome: Adequate for Discharge Goal: Pt Will Transfer Bed To Chair/Chair To Bed Outcome: Adequate for Discharge   Problem: Malnutrition  (NI-5.2) Goal: Food and/or nutrient delivery Description: Individualized approach for food/nutrient provision. Outcome: Adequate for Discharge

## 2024-09-08 NOTE — Anesthesia Postprocedure Evaluation (Signed)
 Anesthesia Post Note  Patient: Cheryl Olson  Procedure(s) Performed: INSERTION, INTRAMEDULLARY ROD, FEMUR, RETROGRADE (Right)     Patient location during evaluation: PACU Anesthesia Type: General Level of consciousness: patient cooperative Pain management: pain level controlled Vital Signs Assessment: post-procedure vital signs reviewed and stable Respiratory status: spontaneous breathing, nonlabored ventilation and respiratory function stable Cardiovascular status: blood pressure returned to baseline and stable Postop Assessment: no apparent nausea or vomiting Anesthetic complications: no   No notable events documented.                  Winni Ehrhard
